# Patient Record
Sex: Female | Born: 1990 | Race: White | Hispanic: No | Marital: Single | Smoking: Former smoker
Health system: Southern US, Community
[De-identification: ages and names within clinical notes are randomized; demographics above are authoritative.]

## PROBLEM LIST (undated history)

## (undated) ENCOUNTER — Inpatient Hospital Stay (HOSPITAL_COMMUNITY): Payer: Self-pay

## (undated) DIAGNOSIS — G8929 Other chronic pain: Secondary | ICD-10-CM

## (undated) DIAGNOSIS — R51 Headache: Secondary | ICD-10-CM

## (undated) DIAGNOSIS — J302 Other seasonal allergic rhinitis: Secondary | ICD-10-CM

## (undated) DIAGNOSIS — S069X9A Unspecified intracranial injury with loss of consciousness of unspecified duration, initial encounter: Secondary | ICD-10-CM

## (undated) DIAGNOSIS — R519 Headache, unspecified: Secondary | ICD-10-CM

## (undated) HISTORY — PX: WISDOM TOOTH EXTRACTION: SHX21

---
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
## 2012-04-26 ENCOUNTER — Other Ambulatory Visit: Payer: Self-pay | Admitting: Nurse Practitioner

## 2012-04-26 ENCOUNTER — Other Ambulatory Visit (HOSPITAL_COMMUNITY)
Admission: RE | Admit: 2012-04-26 | Discharge: 2012-04-26 | Disposition: A | Discharge status: Home / Self Care | Payer: BC Managed Care – HMO | Source: Ambulatory Visit | Attending: Obstetrics and Gynecology | Admitting: Obstetrics and Gynecology

## 2012-04-26 DIAGNOSIS — Z113 Encounter for screening for infections with a predominantly sexual mode of transmission: Secondary | ICD-10-CM | POA: Insufficient documentation

## 2012-11-10 ENCOUNTER — Ambulatory Visit: Payer: Self-pay | Admitting: Physician Assistant

## 2012-11-10 VITALS — BP 112/78 | HR 76 | Temp 98.1°F | Resp 16 | Ht 67.5 in | Wt 151.0 lb

## 2012-11-10 DIAGNOSIS — H6990 Unspecified Eustachian tube disorder, unspecified ear: Secondary | ICD-10-CM

## 2012-11-10 DIAGNOSIS — H6980 Other specified disorders of Eustachian tube, unspecified ear: Secondary | ICD-10-CM

## 2012-11-10 DIAGNOSIS — H6993 Unspecified Eustachian tube disorder, bilateral: Secondary | ICD-10-CM

## 2012-11-10 DIAGNOSIS — H6690 Otitis media, unspecified, unspecified ear: Secondary | ICD-10-CM

## 2012-11-10 DIAGNOSIS — H6692 Otitis media, unspecified, left ear: Secondary | ICD-10-CM

## 2012-11-10 DIAGNOSIS — H6983 Other specified disorders of Eustachian tube, bilateral: Secondary | ICD-10-CM

## 2012-11-10 MED ORDER — AMOXICILLIN 875 MG PO TABS: 875.0000 mg | ORAL_TABLET | Freq: Two times a day (BID) | ORAL | Status: DC

## 2012-11-10 MED ORDER — IPRATROPIUM BROMIDE 0.03 % NA SOLN: 2.0000 | Freq: Two times a day (BID) | NASAL | Status: DC

## 2012-11-10 NOTE — Progress Notes (Signed)
  Subjective:    Patient ID: Cassidy Thompson, female    DOB: [birth year], 22 y.o.   MRN: [id number]  Otalgia  Associated symptoms include coughing and rhinorrhea. Pertinent negatives include no abdominal pain, ear discharge, headaches or vomiting.   22 year old female presents with 6 day history of left ear pain, pressure, and decreased hearing.  Admits both ears feel full but left is worse than the right.  Also complains of slight nasal congestion, rhinorrhea, and dry cough.  Does have hx of allergic rhinitis treated with Zyrtec and Singulair in the past but is not currently taking them.  Denies fever, chills, nausea, vomiting, headache, tinnitus, SOB, nausea, vomiting, or dizziness.  Had strep 2 weeks ago that was treated with PCN.  Admits to hx of ear infections, usually once per year.   Patient is otherwise healthy with no other concerns today.   Works at a daycare.      Review of Systems  Constitutional: Negative for fever and chills.  HENT: Positive for ear pain, congestion, rhinorrhea and postnasal drip. Negative for ear discharge.   Respiratory: Positive for cough. Negative for shortness of breath.   Gastrointestinal: Negative for nausea, vomiting and abdominal pain.  Neurological: Negative for dizziness and headaches.       Objective:   Physical Exam  Constitutional: She is oriented to person, place, and time. She appears well-developed and well-nourished.  HENT:  Head: Normocephalic and atraumatic.  Right Ear: Hearing, external ear and ear canal normal. No drainage. Tympanic membrane is not perforated. A middle ear effusion is present.  Left Ear: Hearing, external ear and ear canal normal. No drainage. Tympanic membrane is erythematous. Tympanic membrane is not perforated. A middle ear effusion is present.  Mouth/Throat: Uvula is midline, oropharynx is clear and moist and mucous membranes are normal.  Eyes: Conjunctivae are normal.  Neck: Normal range of motion. Neck supple.   Cardiovascular: Normal rate, regular rhythm and normal heart sounds.   Lymphadenopathy:    She has no cervical adenopathy.  Neurological: She is alert and oriented to person, place, and time.  Psychiatric: She has a normal mood and affect. Her behavior is normal. Judgment and thought content normal.          Assessment & Plan:  ETD (eustachian tube dysfunction), bilateral - Plan: ipratropium (ATROVENT) 0.03 % nasal spray  Otitis media, left - Plan: amoxicillin (AMOXIL) 875 MG tablet  ETD with early otitis media.  Start Atrovent NS twice daily and amoxicillin 875 mg bid x 10 days Recommend Zyrtec daily to help with seasonal allergies.  Will hold on Singulair at this time due to allergies fairly well controlled. Ok to rx if requested.

## 2013-01-19 ENCOUNTER — Inpatient Hospital Stay (HOSPITAL_COMMUNITY): Payer: Medicaid Other

## 2013-01-19 ENCOUNTER — Encounter (HOSPITAL_COMMUNITY): Payer: Self-pay

## 2013-01-19 ENCOUNTER — Inpatient Hospital Stay (HOSPITAL_COMMUNITY)
Admission: AD | Admit: 2013-01-19 | Discharge: 2013-01-19 | Disposition: A | Discharge status: Home / Self Care | Payer: Medicaid Other | Source: Ambulatory Visit | Attending: Obstetrics & Gynecology | Admitting: Obstetrics & Gynecology

## 2013-01-19 DIAGNOSIS — R109 Unspecified abdominal pain: Secondary | ICD-10-CM | POA: Insufficient documentation

## 2013-01-19 DIAGNOSIS — O021 Missed abortion: Secondary | ICD-10-CM | POA: Insufficient documentation

## 2013-01-19 DIAGNOSIS — O039 Complete or unspecified spontaneous abortion without complication: Secondary | ICD-10-CM

## 2013-01-19 DIAGNOSIS — O3680X1 Pregnancy with inconclusive fetal viability, fetus 1: Secondary | ICD-10-CM

## 2013-01-19 DIAGNOSIS — O200 Threatened abortion: Secondary | ICD-10-CM

## 2013-01-19 HISTORY — DX: Unspecified intracranial injury with loss of consciousness of unspecified duration, initial encounter: S06.9X9A

## 2013-01-19 LAB — URINE MICROSCOPIC-ADD ON

## 2013-01-19 LAB — CBC
HCT: 36.4 % (ref 36.0–46.0)
Hemoglobin: 12.8 g/dL (ref 12.0–15.0)
MCH: 30.8 pg (ref 26.0–34.0)
MCHC: 35.2 g/dL (ref 30.0–36.0)
MCV: 87.5 fL (ref 78.0–100.0)
Platelets: 234 10*3/uL (ref 150–400)
RBC: 4.16 MIL/uL (ref 3.87–5.11)
RDW: 12.4 % (ref 11.5–15.5)
WBC: 9.2 K/uL (ref 4.0–10.5)

## 2013-01-19 LAB — URINALYSIS, ROUTINE W REFLEX MICROSCOPIC
Bilirubin Urine: NEGATIVE
Glucose, UA: NEGATIVE mg/dL
Ketones, ur: NEGATIVE mg/dL
Nitrite: NEGATIVE
Protein, ur: NEGATIVE mg/dL
Specific Gravity, Urine: 1.015 (ref 1.005–1.030)
Urobilinogen, UA: 0.2 mg/dL (ref 0.0–1.0)
pH: 7 (ref 5.0–8.0)

## 2013-01-19 LAB — WET PREP, GENITAL
Clue Cells Wet Prep HPF POC: NONE SEEN
Trich, Wet Prep: NONE SEEN
Yeast Wet Prep HPF POC: NONE SEEN

## 2013-01-19 LAB — ABO/RH: ABO/RH(D): A POS

## 2013-01-19 LAB — HCG, QUANTITATIVE, PREGNANCY: hCG, Beta Chain, Quant, S: 4870 m[IU]/mL — ABNORMAL HIGH (ref <5)

## 2013-01-19 MED ORDER — IBUPROFEN 800 MG PO TABS: 800.0000 mg | ORAL_TABLET | Freq: Three times a day (TID) | ORAL | Status: DC

## 2013-01-19 MED ORDER — OXYCODONE-ACETAMINOPHEN 5-325 MG PO TABS: 2.0000 | ORAL_TABLET | ORAL | Status: DC | PRN

## 2013-01-19 MED ORDER — PROMETHAZINE HCL 25 MG PO TABS: 25.0000 mg | ORAL_TABLET | Freq: Four times a day (QID) | ORAL | Status: DC | PRN

## 2013-01-19 NOTE — MAU Note (Signed)
Patient states she has had a positive pregnancy test at [facility]. States she has been having mild cramping for about 2 weeks but getting worse. Started spotting yesterday, moe today.

## 2013-01-19 NOTE — Discharge Instructions (Signed)
Miscarriage A miscarriage is the sudden loss of an unborn baby (fetus) before the 20th week of pregnancy. Most miscarriages happen in the first 3 months of pregnancy. Sometimes, it happens before a woman even knows she is pregnant. A miscarriage is also called a "spontaneous miscarriage" or "early pregnancy loss." Having a miscarriage can be an emotional experience. Talk with your caregiver about any questions you may have about miscarrying, the grieving process, and your future pregnancy plans. CAUSES   Problems with the fetal chromosomes that make it impossible for the baby to develop normally. Problems with the baby's genes or chromosomes are most often the result of errors that occur, by chance, as the embryo divides and grows. The problems are not inherited from the parents.  Infection of the cervix or uterus.   Hormone problems.   Problems with the cervix, such as having an incompetent cervix. This is when the tissue in the cervix is not strong enough to hold the pregnancy.   Problems with the uterus, such as an abnormally shaped uterus, uterine fibroids, or congenital abnormalities.   Certain medical conditions.   Smoking, drinking alcohol, or taking illegal drugs.   Trauma.  Often, the cause of a miscarriage is unknown.  SYMPTOMS   Vaginal bleeding or spotting, with or without cramps or pain.  Pain or cramping in the abdomen or lower back.  Passing fluid, tissue, or blood clots from the vagina. DIAGNOSIS  Your caregiver will perform a physical exam. You may also have an ultrasound to confirm the miscarriage. Blood or urine tests may also be ordered. TREATMENT   Sometimes, treatment is not necessary if you naturally pass all the fetal tissue that was in the uterus. If some of the fetus or placenta remains in the body (incomplete miscarriage), tissue left behind may become infected and must be removed. Usually, a dilation and curettage (D and C) procedure is performed.  During a D and C procedure, the cervix is widened (dilated) and any remaining fetal or placental tissue is gently removed from the uterus.  Antibiotic medicines are prescribed if there is an infection. Other medicines may be given to reduce the size of the uterus (contract) if there is a lot of bleeding.  If you have Rh negative blood and your baby was Rh positive, you will need a Rh immunoglobulin shot. This shot will protect any future baby from having Rh blood problems in future pregnancies. HOME CARE INSTRUCTIONS   Your caregiver may order bed rest or may allow you to continue light activity. Resume activity as directed by your caregiver.  Have someone help with home and family responsibilities during this time.   Keep track of the number of sanitary pads you use each day and how soaked (saturated) they are. Write down this information.   Do not use tampons. Do not douche or have sexual intercourse until approved by your caregiver.   Only take over-the-counter or prescription medicines for pain or discomfort as directed by your caregiver.   Do not take aspirin. Aspirin can cause bleeding.   Keep all follow-up appointments with your caregiver.   If you or your partner have problems with grieving, talk to your caregiver or seek counseling to help cope with the pregnancy loss. Allow enough time to grieve before trying to get pregnant again.  SEEK IMMEDIATE MEDICAL CARE IF:   You have severe cramps or pain in your back or abdomen.  You have a fever.  You pass large blood clots (walnut-sized   or larger) ortissue from your vagina. Save any tissue for your caregiver to inspect.   Your bleeding increases.   You have a thick, bad-smelling vaginal discharge.  You become lightheaded, weak, or you faint.   You have chills.  MAKE SURE YOU:  Understand these instructions.  Will watch your condition.  Will get help right away if you are not doing well or get  worse. Document Released: 07/20/2000 Document Revised: 05/21/2012 Document Reviewed: 03/15/2011 [employer] Patient Information 2014 [employer].  

## 2013-01-19 NOTE — MAU Note (Signed)
22 yo, G1P0 at [redacted]w[redacted]d, presents to MAU with c/o VB since 1600 yesterday; reports intermittent cramping. Denies taking anything for pain at this time.

## 2013-01-19 NOTE — MAU Provider Note (Signed)
History     CSN: [id number]  Arrival date and time: 01/19/13 1523   First Provider Initiated Contact with Patient 01/19/13 1608      Chief Complaint  Patient presents with  . Vaginal Bleeding  . Abdominal Pain   HPI Ms. Cassidy Thompson is a 22 y.o. G1P0 at 109w1d who presents to MAU today with complaint of vaginal bleeding. The patient states that she notices pink spotting with wiping only. She is also having associate diffuse mild cramping abdominal pain that comes and goes. She endorses fatigue and nausea without vomiting, diarrhea or constipation. She denies dizziness or weakness, fever, vaginal discharge, UTI symptoms or fever. Last intercourse was ~ 5 days ago.   OB History   Grav Para Term Preterm Abortions TAB SAB Ect Mult Living   1               Past Medical History  Diagnosis Date  . Allergy   . Closed TBI (traumatic brain injury)     Past Surgical History  Procedure Laterality Date  . Brain surgery      injury in 2011  . Wisdom tooth extraction      Family History  Problem Relation Age of Onset  . Diabetes Maternal Grandmother     History  Substance Use Topics  . Smoking status: Former Games developer  . Smokeless tobacco: Not on file  . Alcohol Use: No    Allergies: No Known Allergies  Prescriptions prior to admission  Medication Sig Dispense Refill  . Prenatal Vit-Fe Fumarate-FA (PRENATAL MULTIVITAMIN) TABS tablet Take 1 tablet by mouth daily at 12 noon.        Review of Systems  Constitutional: Positive for malaise/fatigue. Negative for fever.  Gastrointestinal: Positive for nausea and abdominal pain. Negative for vomiting, diarrhea and constipation.  Genitourinary: Negative for dysuria, urgency and frequency.       + vaginal bleeding Neg - vaginal discharge  Neurological: Negative for dizziness, loss of consciousness and weakness.   Physical Exam   Blood pressure 119/67, pulse 104, temperature 98.5 F (36.9 C), temperature source Oral, resp.  rate 18, height 5\' 7"  (1.702 m), weight 149 lb 9.6 oz (67.858 kg), last menstrual period 11/23/2012, SpO2 100.00%.  Physical Exam  Constitutional: She is oriented to person, place, and time. She appears well-developed and well-nourished. No distress.  HENT:  Head: Normocephalic and atraumatic.  Cardiovascular: Normal rate, regular rhythm and normal heart sounds.   Respiratory: Effort normal and breath sounds normal. No respiratory distress.  GI: Soft. Bowel sounds are normal. She exhibits no distension and no mass. There is no tenderness. There is no rebound and no guarding.  Genitourinary: Uterus is enlarged (appropriate for GA). Uterus is not tender. Cervix exhibits no motion tenderness, no discharge and no friability. Right adnexum displays no mass and no tenderness. Left adnexum displays no mass and no tenderness. There is bleeding (scant light pink blood noted) around the vagina. Vaginal discharge (scant blood tinged mucus discharge noted) found.  Neurological: She is alert and oriented to person, place, and time.  Skin: Skin is warm and dry. No erythema.  Psychiatric: She has a normal mood and affect.  Cervix: closed, long, thick  Results for orders placed during the hospital encounter of 01/19/13 (from the past 24 hour(s))  URINALYSIS, ROUTINE W REFLEX MICROSCOPIC     Status: Abnormal   Collection Time    01/19/13  3:45 PM      Result Value Range   Color,  Urine YELLOW  YELLOW   APPearance CLEAR  CLEAR   Specific Gravity, Urine 1.015  1.005 - 1.030   pH 7.0  5.0 - 8.0   Glucose, UA NEGATIVE  NEGATIVE mg/dL   Hgb urine dipstick LARGE (*) NEGATIVE   Bilirubin Urine NEGATIVE  NEGATIVE   Ketones, ur NEGATIVE  NEGATIVE mg/dL   Protein, ur NEGATIVE  NEGATIVE mg/dL   Urobilinogen, UA 0.2  0.0 - 1.0 mg/dL   Nitrite NEGATIVE  NEGATIVE   Leukocytes, UA TRACE (*) NEGATIVE  URINE MICROSCOPIC-ADD ON     Status: Abnormal   Collection Time    01/19/13  3:45 PM      Result Value Range    Squamous Epithelial / LPF RARE  RARE   WBC, UA 3-6  <3 WBC/hpf   RBC / HPF 0-2  <3 RBC/hpf   Bacteria, UA FEW (*) RARE  WET PREP, GENITAL     Status: Abnormal   Collection Time    01/19/13  4:20 PM      Result Value Range   Yeast Wet Prep HPF POC NONE SEEN  NONE SEEN   Trich, Wet Prep NONE SEEN  NONE SEEN   Clue Cells Wet Prep HPF POC NONE SEEN  NONE SEEN   WBC, Wet Prep HPF POC MODERATE (*) NONE SEEN  HCG, QUANTITATIVE, PREGNANCY     Status: Abnormal   Collection Time    01/19/13  4:35 PM      Result Value Range   hCG, Beta Chain, Quant, S 4870 (*) <5 mIU/mL  CBC     Status: None   Collection Time    01/19/13  4:35 PM      Result Value Range   WBC 9.2  4.0 - 10.5 K/uL   RBC 4.16  3.87 - 5.11 MIL/uL   Hemoglobin 12.8  12.0 - 15.0 g/dL   HCT 16.1  09.6 - 04.5 %   MCV 87.5  78.0 - 100.0 fL   MCH 30.8  26.0 - 34.0 pg   MCHC 35.2  30.0 - 36.0 g/dL   RDW 40.9  81.1 - 91.4 %   Platelets 234  150 - 400 K/uL  ABO/RH     Status: None   Collection Time    01/19/13  4:35 PM      Result Value Range   ABO/RH(D) A POS     US Ob Transvaginal  01/19/2013   CLINICAL DATA:  Vaginal bleeding since 1600 hr 01/18/2013. Intermittent cramping.  EXAM: OBSTETRIC <14 WK [place] AND TRANSVAGINAL OB US  TECHNIQUE: Both transabdominal and transvaginal ultrasound examinations were performed for complete evaluation of the gestation as well as the maternal uterus, adnexal regions, and pelvic cul-de-sac. Transvaginal technique was performed to assess early pregnancy.  COMPARISON:  None.  FINDINGS: Intrauterine gestational sac: Visualized, and normal in shape. Single.  Yolk sac:  Present  Embryo:  Present  Cardiac Activity: Absent  Heart Rate:  0 bpm  CRL:   9  mm   7 w 0 d                  [place] EDC: 09/07/2013  Maternal uterus/adnexae: Small subchorionic hemorrhage. Normal right ovary. Left ovary corpus luteum cyst. No free fluid.  IMPRESSION: Findings meet definitive criteria for failed pregnancy. Crown-rump length  of greater than or equal to 7 mm with no cardiac activity. This follows SRU consensus guidelines: Diagnostic Criteria for Nonviable Pregnancy Early in the First Trimester. N Engl  J Med [phone].   Electronically Signed   By: Davonna Belling M.D.   On: 01/19/2013 17:31     MAU Course  Procedures None  MDM Discussed patient with Dr. Su Hilt. Rule out ectopic pregnancy with quant hCG, CBC and [place] today UA, Wet prep, GC/Chlamydia, CBC, ABO/Rh, quant hCG and [place] today Discussed [place] and lab results with Dr. Su Hilt. Discuss management options with patient Patient counseled on expectant management vs Cytotec vs D&C. Patient would prefer expectant management at this time.  Bleeding precautions discussed Assessment and Plan  A: Missed AB  P: Discharge home Rx for Percocet, Ibuprofen and Phenergan given/sent to patient's pharmacy Bleeding precautions discussed Patient advised to keep scheduled appointment for follow-up on Tuesday Patient may return to MAU as needed or if her condition were to change or worsen   Freddi Starr, PA-C  01/19/2013, 5:58 PM

## 2013-01-20 LAB — URINE CULTURE: Colony Count: 4000

## 2013-01-21 LAB — GC/CHLAMYDIA PROBE AMP
CT Probe RNA: NEGATIVE
GC Probe RNA: NEGATIVE

## 2013-06-27 ENCOUNTER — Other Ambulatory Visit: Payer: Self-pay | Admitting: Nurse Practitioner

## 2013-06-27 ENCOUNTER — Other Ambulatory Visit (HOSPITAL_COMMUNITY)
Admission: RE | Admit: 2013-06-27 | Discharge: 2013-06-27 | Disposition: A | Discharge status: Home / Self Care | Payer: Medicaid Other | Source: Ambulatory Visit | Attending: Nurse Practitioner | Admitting: Nurse Practitioner

## 2013-06-27 DIAGNOSIS — Z113 Encounter for screening for infections with a predominantly sexual mode of transmission: Secondary | ICD-10-CM | POA: Insufficient documentation

## 2013-06-27 DIAGNOSIS — Z01419 Encounter for gynecological examination (general) (routine) without abnormal findings: Secondary | ICD-10-CM | POA: Insufficient documentation

## 2013-11-24 ENCOUNTER — Encounter (HOSPITAL_COMMUNITY): Payer: Self-pay | Admitting: *Deleted

## 2013-12-09 ENCOUNTER — Encounter (HOSPITAL_COMMUNITY): Payer: Self-pay | Admitting: *Deleted

## 2014-07-29 ENCOUNTER — Other Ambulatory Visit: Payer: Self-pay | Admitting: Nurse Practitioner

## 2014-07-29 ENCOUNTER — Other Ambulatory Visit (HOSPITAL_COMMUNITY)
Admission: RE | Admit: 2014-07-29 | Discharge: 2014-07-29 | Disposition: A | Discharge status: Home / Self Care | Payer: BLUE CROSS/BLUE SHIELD | Source: Ambulatory Visit | Attending: Nurse Practitioner | Admitting: Nurse Practitioner

## 2014-07-29 DIAGNOSIS — Z113 Encounter for screening for infections with a predominantly sexual mode of transmission: Secondary | ICD-10-CM | POA: Diagnosis present

## 2014-07-29 DIAGNOSIS — Z01419 Encounter for gynecological examination (general) (routine) without abnormal findings: Secondary | ICD-10-CM | POA: Diagnosis present

## 2014-08-05 LAB — CYTOLOGY - PAP

## 2015-07-09 DIAGNOSIS — J309 Allergic rhinitis, unspecified: Secondary | ICD-10-CM | POA: Diagnosis not present

## 2015-07-09 DIAGNOSIS — F0781 Postconcussional syndrome: Secondary | ICD-10-CM | POA: Diagnosis not present

## 2015-07-09 DIAGNOSIS — Z0000 Encounter for general adult medical examination without abnormal findings: Secondary | ICD-10-CM | POA: Diagnosis not present

## 2015-08-06 ENCOUNTER — Other Ambulatory Visit (HOSPITAL_COMMUNITY)
Admission: RE | Admit: 2015-08-06 | Discharge: 2015-08-06 | Disposition: A | Discharge status: Home / Self Care | Payer: 59 | Source: Ambulatory Visit | Attending: Nurse Practitioner | Admitting: Nurse Practitioner

## 2015-08-06 ENCOUNTER — Other Ambulatory Visit: Payer: Self-pay | Admitting: Nurse Practitioner

## 2015-08-06 DIAGNOSIS — N760 Acute vaginitis: Secondary | ICD-10-CM | POA: Insufficient documentation

## 2015-08-06 DIAGNOSIS — Z113 Encounter for screening for infections with a predominantly sexual mode of transmission: Secondary | ICD-10-CM | POA: Diagnosis present

## 2015-08-06 DIAGNOSIS — Z01419 Encounter for gynecological examination (general) (routine) without abnormal findings: Secondary | ICD-10-CM | POA: Diagnosis present

## 2015-08-06 DIAGNOSIS — Z309 Encounter for contraceptive management, unspecified: Secondary | ICD-10-CM | POA: Diagnosis not present

## 2015-08-06 DIAGNOSIS — N72 Inflammatory disease of cervix uteri: Secondary | ICD-10-CM | POA: Diagnosis not present

## 2015-08-07 LAB — CYTOLOGY - PAP

## 2015-11-08 ENCOUNTER — Ambulatory Visit (HOSPITAL_COMMUNITY)
Admission: EM | Admit: 2015-11-08 | Discharge: 2015-11-08 | Disposition: A | Discharge status: Home / Self Care | Payer: 59 | Attending: Family Medicine | Admitting: Family Medicine

## 2015-11-08 ENCOUNTER — Ambulatory Visit (INDEPENDENT_AMBULATORY_CARE_PROVIDER_SITE_OTHER): Payer: 59

## 2015-11-08 ENCOUNTER — Encounter (HOSPITAL_COMMUNITY): Payer: Self-pay | Admitting: *Deleted

## 2015-11-08 DIAGNOSIS — M79604 Pain in right leg: Secondary | ICD-10-CM

## 2015-11-08 HISTORY — DX: Headache, unspecified: R51.9

## 2015-11-08 HISTORY — DX: Other chronic pain: G89.29

## 2015-11-08 HISTORY — DX: Headache: R51

## 2015-11-08 HISTORY — DX: Other seasonal allergic rhinitis: J30.2

## 2015-11-08 MED ORDER — NAPROXEN 500 MG PO TABS: 500.0000 mg | ORAL_TABLET | Freq: Two times a day (BID) | ORAL | 0 refills | Status: DC

## 2015-11-08 NOTE — ED Triage Notes (Signed)
Reports jumping off the back of golf cart last night; landed onto both feet, but had instant pain to right lateral knee extending into proximal lower leg.  C/O painful ambulation and rotation of leg.  Able to bear weight.

## 2015-11-08 NOTE — Discharge Instructions (Signed)
Follow up with orthopedics if pain is not better in 1 week.

## 2015-11-08 NOTE — ED Provider Notes (Signed)
CSN: [id number]     Arrival date & time 11/08/15  1648 History   None    Chief Complaint  Patient presents with  . Leg Injury   (Consider location/radiation/quality/duration/timing/severity/associated sxs/prior Treatment) 25 y.o. female presents with pain to lateral aspect of her right tibia fib X 1 day. Patient states that she was standing on the back of a golf cart when it when over a bump and she fell off. Patient states that she landed on her feet and denies any additional injures. Condition is acute in nature. Condition is made better by nothing. Condition is made worse by nothing. Patient denies any treatment prior to there arrival at this facility. Patient is able to bear weight on the affected extremity but states that the pain radiates from proximal end of the tib fib down to the distal end. Extremity is warm, cap refill is < 2, no decrease in sensation       Past Medical History:  Diagnosis Date  . Chronic headaches   . Closed TBI (traumatic brain injury) ([place])   . Seasonal allergies    Past Surgical History:  Procedure Laterality Date  . BRAIN SURGERY     injury in 2011  . WISDOM TOOTH EXTRACTION     Family History  Problem Relation Age of Onset  . Diabetes Maternal Grandmother    Social History  Substance Use Topics  . Smoking status: Former Research scientist (life sciences)  . Smokeless tobacco: Never Used  . Alcohol use Yes     Comment: occasional   OB History    Gravida Para Term Preterm AB Living   1             SAB TAB Ectopic Multiple Live Births                 Review of Systems  Constitutional: Negative.   Musculoskeletal:       Pain to lateral aspect of right lower leg. Radiates down leg    Allergies  Review of patient's allergies indicates no known allergies.  Home Medications   Prior to Admission medications   Medication Sig Start Date End Date Taking? Authorizing Provider  Cetirizine HCl (ZYRTEC PO) Take by mouth.   Yes Historical Provider, MD  GABAPENTIN PO  Take by mouth.   Yes Historical Provider, MD  Montelukast Sodium (SINGULAIR PO) Take by mouth.   Yes Historical Provider, MD  UNKNOWN TO PATIENT BCPs   Yes Historical Provider, MD  ibuprofen (ADVIL,MOTRIN) 800 MG tablet Take 1 tablet (800 mg total) by mouth 3 (three) times daily. 01/19/13   Luvenia Redden, PA-C  naproxen (NAPROSYN) 500 MG tablet Take 1 tablet (500 mg total) by mouth 2 (two) times daily. 11/08/15   Jacqualine Mau, NP  oxyCODONE-acetaminophen (PERCOCET/ROXICET) 5-325 MG per tablet Take 2 tablets by mouth every 4 (four) hours as needed for severe pain. 01/19/13   Luvenia Redden, PA-C  Prenatal Vit-Fe Fumarate-FA (PRENATAL MULTIVITAMIN) TABS tablet Take 1 tablet by mouth daily at 12 noon.    Historical Provider, MD  promethazine (PHENERGAN) 25 MG tablet Take 1 tablet (25 mg total) by mouth every 6 (six) hours as needed for nausea or vomiting. 01/19/13   Luvenia Redden, PA-C   Meds Ordered and Administered this Visit  Medications - No data to display  LMP 09/15/2015 (Exact Date) Comment: Takes consecutive BCPs No data found.   Physical Exam  Constitutional: She is oriented to person, place, and time. She appears well-developed and  well-nourished.  HENT:  Head: Normocephalic and atraumatic.  Eyes: Conjunctivae are normal.  Neck: Normal range of motion.  Pulmonary/Chest: Effort normal.  Musculoskeletal: Normal range of motion. She exhibits tenderness (to lateral aspect of right lower leg). She exhibits no edema or deformity.  Neurological: She is alert and oriented to person, place, and time.  Skin: Skin is warm and dry. Capillary refill takes less than 2 seconds.  Psychiatric: She has a normal mood and affect.  Nursing note and vitals reviewed.   Urgent Care Course   Clinical Course    Procedures (including critical care time)  Labs Review Labs Reviewed - No data to display  Imaging Review Dg Tibia/fibula Right  Result Date: 11/08/2015 CLINICAL DATA:  Fall  from golf cart yesterday with persistent right leg pain, initial encounter EXAM: RIGHT TIBIA AND FIBULA - 2 VIEW COMPARISON:  None. FINDINGS: There is no evidence of fracture or other focal bone lesions. Soft tissues are unremarkable. IMPRESSION: No acute abnormality noted. Electronically Signed   By: Inez Catalina M.D.   On: 11/08/2015 18:20     Visual Acuity Review  Right Eye Distance:   Left Eye Distance:   Bilateral Distance:    Right Eye Near:   Left Eye Near:    Bilateral Near:         MDM   1. Leg pain, lateral, right       Jacqualine Mau, NP 11/08/15 1840

## 2017-07-16 IMAGING — DX DG TIBIA/FIBULA 2V*R*
3 series · 3 of 3 positions shown · non-contrast
Comparison: None.

CLINICAL DATA: Fall from golf cart yesterday with persistent right
leg pain, initial encounter

EXAM:
RIGHT TIBIA AND FIBULA - 2 VIEW

[tibia ap (1 of 2)]
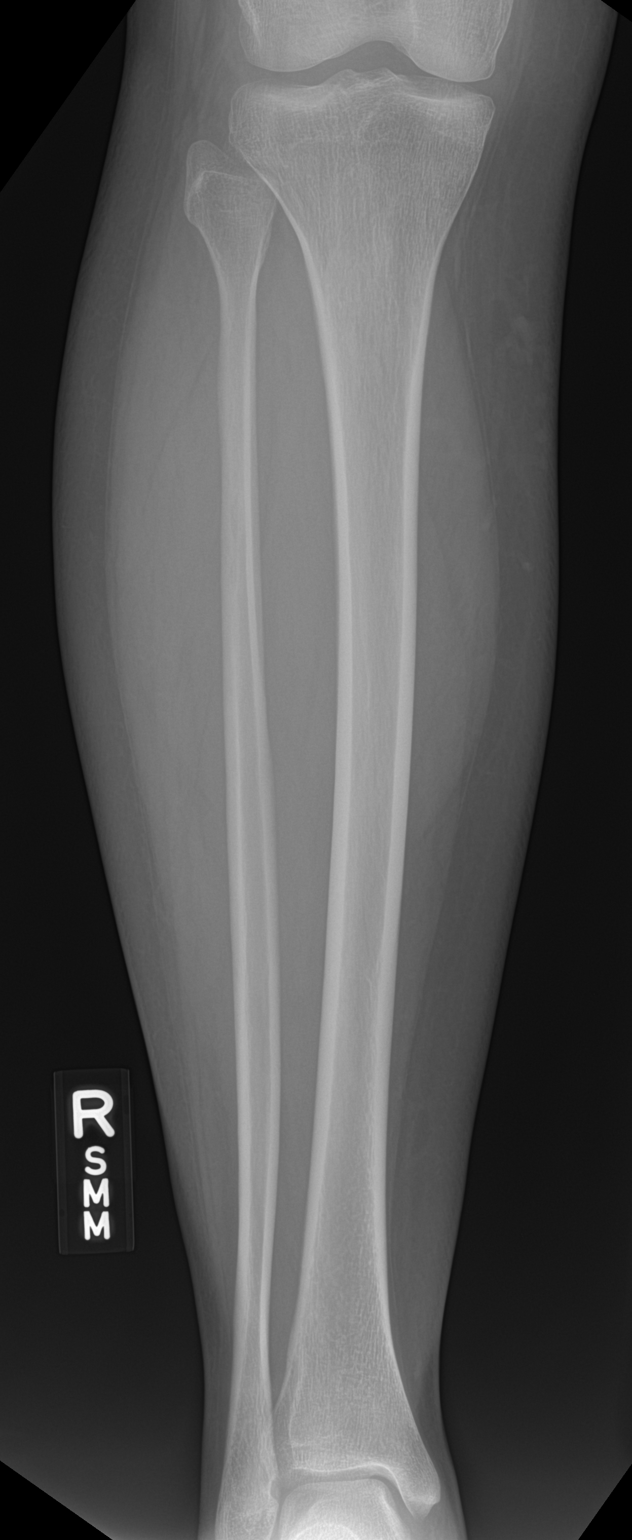

[tibia ap (2 of 2)]
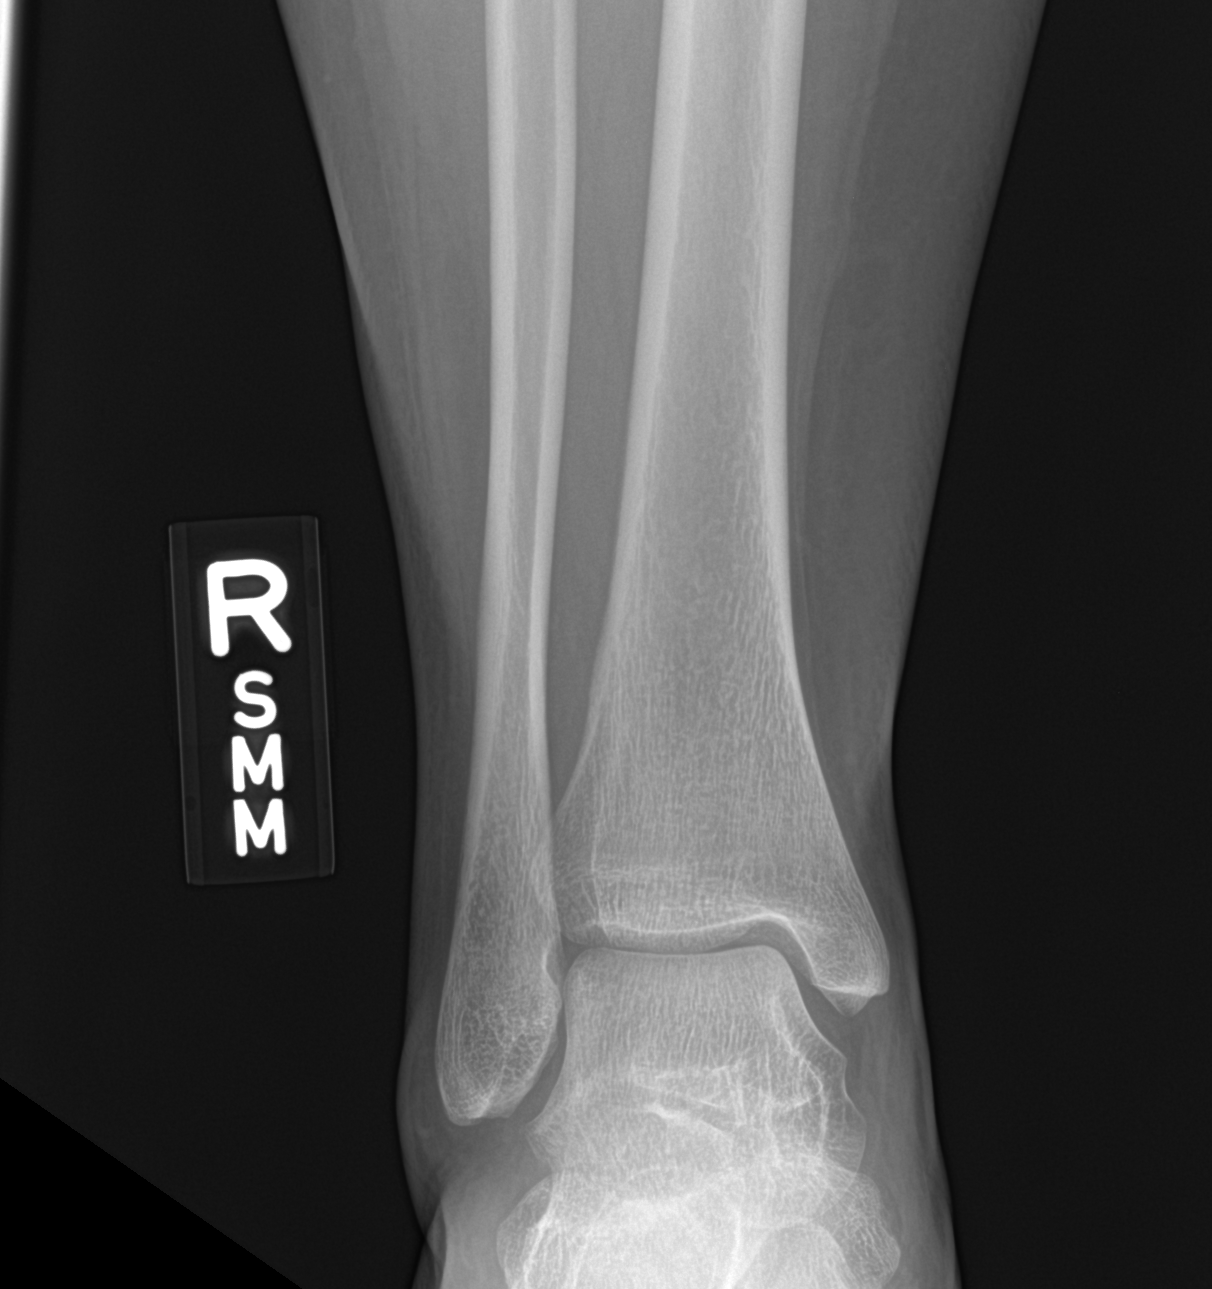

[tibia lat]
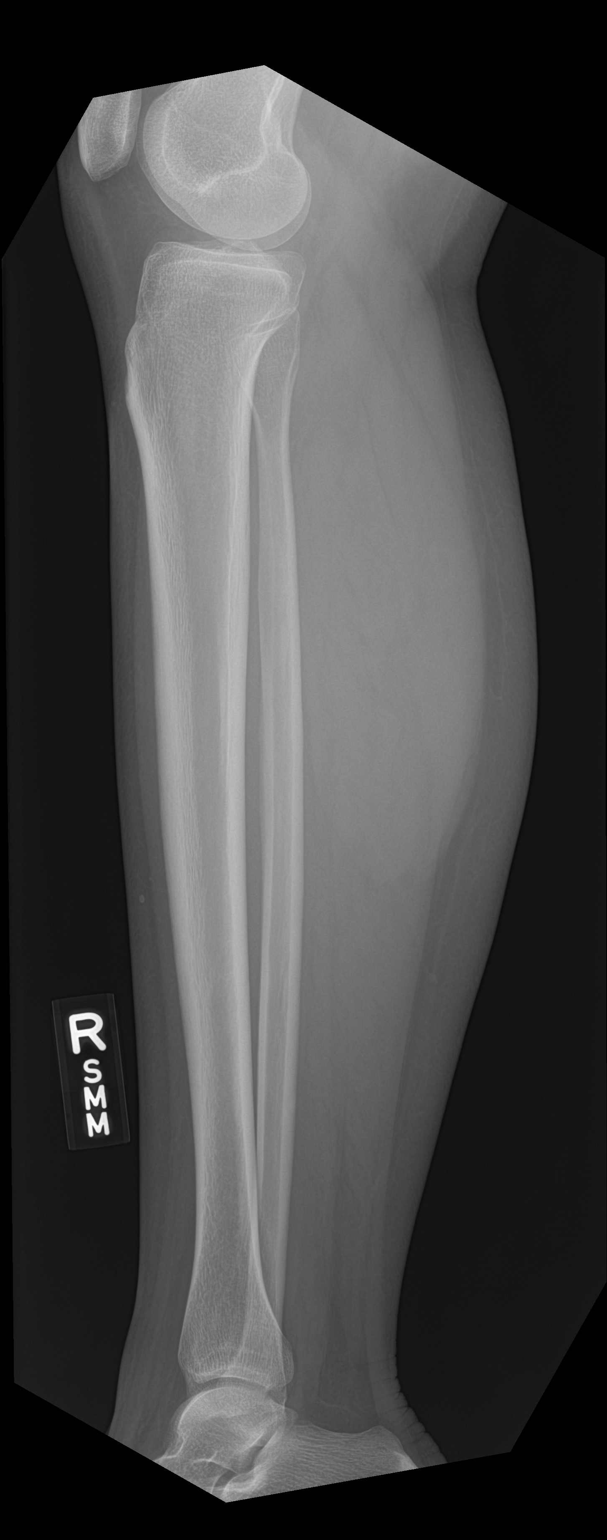

[3 of 3 positions shown; findings below may reference images not displayed]

FINDINGS: There is no evidence of fracture or other focal bone lesions. Soft
tissues are unremarkable.
IMPRESSION: No acute abnormality noted.

## 2017-12-25 ENCOUNTER — Encounter (HOSPITAL_COMMUNITY): Payer: Self-pay | Admitting: Emergency Medicine

## 2017-12-25 ENCOUNTER — Ambulatory Visit (HOSPITAL_COMMUNITY)
Admission: EM | Admit: 2017-12-25 | Discharge: 2017-12-25 | Disposition: A | Discharge status: Home / Self Care | Payer: BC Managed Care – PPO | Attending: Family Medicine | Admitting: Family Medicine

## 2017-12-25 DIAGNOSIS — B349 Viral infection, unspecified: Secondary | ICD-10-CM | POA: Diagnosis not present

## 2017-12-25 MED ORDER — ONDANSETRON 4 MG PO TBDP
4.0000 mg | ORAL_TABLET | Freq: Once | ORAL | Status: AC
  Administered 2017-12-25: 4 mg via ORAL

## 2017-12-25 MED ORDER — IPRATROPIUM BROMIDE 0.06 % NA SOLN: 2.0000 | Freq: Four times a day (QID) | NASAL | 0 refills | Status: DC

## 2017-12-25 MED ORDER — BENZONATATE 100 MG PO CAPS: 100.0000 mg | ORAL_CAPSULE | Freq: Three times a day (TID) | ORAL | 0 refills | Status: DC

## 2017-12-25 MED ORDER — ONDANSETRON 4 MG PO TBDP: 4.0000 mg | ORAL_TABLET | Freq: Three times a day (TID) | ORAL | 0 refills | Status: DC | PRN

## 2017-12-25 MED ORDER — ONDANSETRON 4 MG PO TBDP
ORAL_TABLET | ORAL | Status: AC
  Filled 2017-12-25: qty 1

## 2017-12-25 NOTE — ED Provider Notes (Signed)
[place]    CSN: [id number] Arrival date & time: 12/25/17  1425     History   Chief Complaint Chief Complaint  Patient presents with  . Fever    HPI Cassidy Thompson is a 27 y.o. female.   27 year old female comes in for 3 day history of URI symptoms. States first started with cough, rhinorrhea, nasal congestion. States yesterday, started with body aches and vomiting. Had one episode of nonbilious nonbloody vomiting, states continued nausea. States has decreased appetite, so has not tried to eat again, but has been able to tolerate fluids. Fever, Tmax 101.5, denies abdominal pain, diarrhea. Former smoker. No obvious sick contact.      Past Medical History:  Diagnosis Date  . Chronic headaches   . Closed TBI (traumatic brain injury) ([place])   . Seasonal allergies     There are no active problems to display for this patient.   Past Surgical History:  Procedure Laterality Date  . BRAIN SURGERY     injury in 2011  . WISDOM TOOTH EXTRACTION      OB History    Gravida  1   Para      Term      Preterm      AB      Living        SAB      TAB      Ectopic      Multiple      Live Births               Home Medications    Prior to Admission medications   Medication Sig Start Date End Date Taking? Authorizing Provider  benzonatate (TESSALON) 100 MG capsule Take 1 capsule (100 mg total) by mouth every 8 (eight) hours. 12/25/17   Tasia Catchings, Lexxi Koslow V, PA-C  Cetirizine HCl (ZYRTEC PO) Take by mouth.    [provider]  GABAPENTIN PO Take by mouth.    [provider]  ibuprofen (ADVIL,MOTRIN) 800 MG tablet Take 1 tablet (800 mg total) by mouth 3 (three) times daily. 01/19/13   Luvenia Redden, PA-C  ipratropium (ATROVENT) 0.06 % nasal spray Place 2 sprays into both nostrils 4 (four) times daily. 12/25/17   Tasia Catchings, Myranda Pavone V, PA-C  Montelukast Sodium (SINGULAIR PO) Take by mouth.    [provider]  naproxen (NAPROSYN) 500 MG tablet  Take 1 tablet (500 mg total) by mouth 2 (two) times daily. 11/08/15   Jacqualine Mau, NP  ondansetron (ZOFRAN ODT) 4 MG disintegrating tablet Take 1 tablet (4 mg total) by mouth every 8 (eight) hours as needed for nausea or vomiting. 12/25/17   Tasia Catchings, Gray Maugeri V, PA-C  oxyCODONE-acetaminophen (PERCOCET/ROXICET) 5-325 MG per tablet Take 2 tablets by mouth every 4 (four) hours as needed for severe pain. 01/19/13   Luvenia Redden, PA-C  Prenatal Vit-Fe Fumarate-FA (PRENATAL MULTIVITAMIN) TABS tablet Take 1 tablet by mouth daily at 12 noon.    [provider]  promethazine (PHENERGAN) 25 MG tablet Take 1 tablet (25 mg total) by mouth every 6 (six) hours as needed for nausea or vomiting. 01/19/13   Luvenia Redden, PA-C  UNKNOWN TO PATIENT BCPs    [provider]    Family History Family History  Problem Relation Age of Onset  . Diabetes Maternal Grandmother     Social History Social History   Tobacco Use  . Smoking status: Former Research scientist (life sciences)  . Smokeless tobacco: Never Used  Substance Use Topics  . Alcohol use: Yes    Comment: occasional  . Drug use: No     Allergies   Patient has no known allergies.   Review of Systems Review of Systems  Reason unable to perform ROS: See HPI as above.     Physical Exam Triage Vital Signs ED Triage Vitals  Enc Vitals Group     BP 12/25/17 1514 140/88     Pulse Rate 12/25/17 1514 (!) 115     Resp 12/25/17 1514 20     Temp 12/25/17 1514 99.4 F (37.4 C)     Temp Source 12/25/17 1514 Oral     SpO2 12/25/17 1514 100 %     Weight --      Height --      Head Circumference --      Peak Flow --      Pain Score 12/25/17 1515 6     Pain Loc --      Pain Edu? --      Excl. in [place]? --    No data found.  Updated Vital Signs BP 140/88 (BP Location: Left Arm)   Pulse (!) 115   Temp 99.4 F (37.4 C) (Oral)   Resp 20   SpO2 100%   Physical Exam  Constitutional: She is oriented to person, place, and time. She appears  well-developed and well-nourished. No distress.  HENT:  Head: Normocephalic and atraumatic.  Right Ear: Tympanic membrane, external ear and ear canal normal. Tympanic membrane is not erythematous and not bulging.  Left Ear: Tympanic membrane, external ear and ear canal normal. Tympanic membrane is not erythematous and not bulging.  Nose: Nose normal. Right sinus exhibits no maxillary sinus tenderness and no frontal sinus tenderness. Left sinus exhibits no maxillary sinus tenderness and no frontal sinus tenderness.  Mouth/Throat: Uvula is midline, oropharynx is clear and moist and mucous membranes are normal.  Eyes: Pupils are equal, round, and reactive to light. Conjunctivae are normal.  Neck: Normal range of motion. Neck supple.  Cardiovascular: Regular rhythm and normal heart sounds. Tachycardia present. Exam reveals no gallop and no friction rub.  No murmur heard. Pulmonary/Chest: Effort normal and breath sounds normal. No stridor. No respiratory distress. She has no decreased breath sounds. She has no wheezes. She has no rhonchi. She has no rales.  Abdominal: Soft. Bowel sounds are normal. She exhibits no mass. There is no tenderness. There is no rebound, no guarding and no CVA tenderness.  Lymphadenopathy:    She has no cervical adenopathy.  Neurological: She is alert and oriented to person, place, and time.  Skin: Skin is warm and dry. She is not diaphoretic.  Psychiatric: She has a normal mood and affect. Her behavior is normal. Judgment normal.     UC Treatments / Results  Labs (all labs ordered are listed, but only abnormal results are displayed) Labs Reviewed - No data to display  EKG None  Radiology No results found.  Procedures Procedures (including critical care time)  Medications Ordered in UC Medications  ondansetron (ZOFRAN-ODT) disintegrating tablet 4 mg (has no administration in time range)    Initial Impression / Assessment and Plan / UC Course  I have  reviewed the triage vital signs and the nursing notes.  Pertinent labs & imaging results that were available during my care of the patient were reviewed by me and considered in my medical decision making (see chart for details).    Discussed with patient history and  exam most consistent with viral URI. Patient nontoxic in appearance. Will provide zofran for nausea/vomiting. Other symptomatic treatment as needed. Push fluids. Return precautions given.   Final Clinical Impressions(s) / UC Diagnoses   Final diagnoses:  Viral illness    ED Prescriptions    Medication Sig Dispense Auth. Provider   ondansetron (ZOFRAN ODT) 4 MG disintegrating tablet Take 1 tablet (4 mg total) by mouth every 8 (eight) hours as needed for nausea or vomiting. 20 tablet Camora Tremain V, PA-C   ipratropium (ATROVENT) 0.06 % nasal spray Place 2 sprays into both nostrils 4 (four) times daily. 15 mL Eden Rho V, PA-C   benzonatate (TESSALON) 100 MG capsule Take 1 capsule (100 mg total) by mouth every 8 (eight) hours. 21 capsule Tobin Chad, PA-C 12/25/17 1600

## 2017-12-25 NOTE — Discharge Instructions (Addendum)
Tessalon for cough. Zofran for nausea/vomiting. Start atrovent nasal spray for nasal congestion/drainage. You can use over the counter nasal saline rinse such as neti pot for nasal congestion. Keep hydrated, your urine should be clear to pale yellow in color. Tylenol/motrin for fever and pain. Monitor for any worsening of symptoms, chest pain, shortness of breath, wheezing, swelling of the throat, follow up for reevaluation.   For sore throat/cough try using a honey-based tea. Use 3 teaspoons of honey with juice squeezed from half lemon. Place shaved pieces of ginger into 1/2-1 cup of water and warm over stove top. Then mix the ingredients and repeat every 4 hours as needed.

## 2017-12-25 NOTE — ED Triage Notes (Signed)
Pt here for fever and body aches and vomiting x 1 starting yesterday

## 2018-03-22 ENCOUNTER — Ambulatory Visit: Payer: BC Managed Care – PPO | Admitting: Primary Care

## 2018-03-22 ENCOUNTER — Encounter: Payer: Self-pay | Admitting: Primary Care

## 2018-03-22 DIAGNOSIS — G43709 Chronic migraine without aura, not intractable, without status migrainosus: Secondary | ICD-10-CM

## 2018-03-22 DIAGNOSIS — G43909 Migraine, unspecified, not intractable, without status migrainosus: Secondary | ICD-10-CM | POA: Insufficient documentation

## 2018-03-22 DIAGNOSIS — R519 Headache, unspecified: Secondary | ICD-10-CM

## 2018-03-22 DIAGNOSIS — R51 Headache: Secondary | ICD-10-CM | POA: Diagnosis not present

## 2018-03-22 DIAGNOSIS — Z87898 Personal history of other specified conditions: Secondary | ICD-10-CM | POA: Diagnosis not present

## 2018-03-22 MED ORDER — SUMATRIPTAN SUCCINATE 25 MG PO TABS
ORAL_TABLET | ORAL | 0 refills | Status: DC
Start: 2018-03-22 — End: 2020-06-04

## 2018-03-22 MED ORDER — SCOPOLAMINE 1 MG/3DAYS TD PT72: MEDICATED_PATCH | TRANSDERMAL | 0 refills | Status: DC

## 2018-03-22 NOTE — Assessment & Plan Note (Signed)
Occur 1-2 times weekly on average and are overall mild. Uses Tylenol with resolve.  She does not wish to resume daily preventative treatment, this seems reasonable given the nature of her headaches. Continue Tylenol. She will update.

## 2018-03-22 NOTE — Patient Instructions (Addendum)
You may use the sumatriptan (Imitrex) tablets as needed for bad migraines. Take 1 tablet by mouth at migraine onset. May repeat in 2 hours if headache persists or recurs.  Continue Tylenol as needed for headaches.  Apply the scopolamine patch to a hairless area four hours prior to starting your cruise. Rotate sites every 3 days.  It was a pleasure to meet you today! Please don't hesitate to call or message me with any questions. Welcome to [facility]!

## 2018-03-22 NOTE — Assessment & Plan Note (Signed)
Occur once monthly on average, usually able to taking Tylenol and sleep off. She is interested in abortive treatment.  Rx for sumatriptan 25 mg sent to pharmacy. Discussed instructions for use.
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
## 2018-03-22 NOTE — Assessment & Plan Note (Signed)
Does well with scopolamine patches PRN. Will be going on a 9 day cruise in April 2020. Rx sent to pharmacy. Discussed directions for use.

## 2018-06-25 ENCOUNTER — Encounter: Payer: Self-pay | Admitting: Primary Care

## 2018-06-25 ENCOUNTER — Ambulatory Visit (INDEPENDENT_AMBULATORY_CARE_PROVIDER_SITE_OTHER): Payer: BC Managed Care – PPO | Admitting: Primary Care

## 2018-06-25 VITALS — Wt 202.0 lb

## 2018-06-25 DIAGNOSIS — J302 Other seasonal allergic rhinitis: Secondary | ICD-10-CM | POA: Diagnosis not present

## 2018-06-25 MED ORDER — CETIRIZINE HCL 10 MG PO TABS: 10.0000 mg | ORAL_TABLET | Freq: Every day | ORAL | 0 refills | Status: DC

## 2018-06-25 MED ORDER — MONTELUKAST SODIUM 10 MG PO TABS: 10.0000 mg | ORAL_TABLET | Freq: Every day | ORAL | 0 refills | Status: DC

## 2018-06-25 NOTE — Assessment & Plan Note (Signed)
Chronic, this year worse than prior years. Given severity of symptoms, will resume her prior regimen of Singulair and Zyrtec. She has done well with this in the past. Also discussed use of nasal sprays. She will update in 1-2 weeks.

## 2018-06-25 NOTE — Patient Instructions (Signed)
Start Zyrtec and Singulair for allergy symptoms as discussed.  Please update me in 1-2 weeks regarding your symptoms.  It was a pleasure to see you today!
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
## 2018-09-16 ENCOUNTER — Other Ambulatory Visit: Payer: Self-pay | Admitting: Primary Care

## 2018-09-16 DIAGNOSIS — J302 Other seasonal allergic rhinitis: Secondary | ICD-10-CM

## 2018-12-17 ENCOUNTER — Ambulatory Visit (INDEPENDENT_AMBULATORY_CARE_PROVIDER_SITE_OTHER)
Admission: RE | Admit: 2018-12-17 | Discharge: 2018-12-17 | Disposition: A | Discharge status: Home / Self Care | Payer: BC Managed Care – PPO | Source: Ambulatory Visit

## 2018-12-17 DIAGNOSIS — J069 Acute upper respiratory infection, unspecified: Secondary | ICD-10-CM

## 2018-12-17 MED ORDER — BENZONATATE 100 MG PO CAPS: 100.0000 mg | ORAL_CAPSULE | Freq: Three times a day (TID) | ORAL | 0 refills | Status: DC

## 2018-12-17 NOTE — Discharge Instructions (Addendum)
Take the cough medication as directed.    Come here to be seen in person or follow-up with your primary care provider if your symptoms are not improving or get worse.

## 2018-12-17 NOTE — ED Provider Notes (Signed)
Virtual Visit via Video Note:  Cassidy Thompson  initiated request for Telemedicine visit with [facility] Urgent [facility] team. I connected with Cassidy Thompson  on 12/17/2018 at 1:45 PM  for a synchronized telemedicine visit using a video enabled HIPPA compliant telemedicine application. I verified that I am speaking with Cassidy Thompson  using two identifiers. Cassidy Balloon, NP  was physically located in a [facility] Urgent care site and Cassidy Thompson was located at a different location.   The limitations of evaluation and management by telemedicine as well as the availability of in-person appointments were discussed. Patient was informed that she  may incur a bill ( including co-pay) for this virtual visit encounter. Cassidy Thompson  expressed understanding and gave verbal consent to proceed with virtual visit.     History of Present Illness:Cassidy Thompson  is a 28 y.o. female presents for evaluation of 5-day history of fever, sore throat, cough productive of green mucus, sinus congestion.  T-max 101.7 but no fever for the past 3 days.  She has been treating her symptoms at home with Tylenol.  She states she had a Covid test done on 12/15/2018 which was negative.  She denies rash, shortness of breath, vomiting, diarrhea, or other symptoms.  LMP: 2 weeks.  Patient states her symptoms have been progressively getting better each day.   No Known Allergies   Past Medical History:  Diagnosis Date  . Chronic headaches   . Closed TBI (traumatic brain injury) ([place]) 2011  . Seasonal allergies      Social History   Tobacco Use  . Smoking status: Former Research scientist (life sciences)  . Smokeless tobacco: Never Used  Substance Use Topics  . Alcohol use: Yes    Comment: occasional  . Drug use: No        Observations/Objective: Physical Exam  VITALS: Patient denies fever for the past 3 days. GENERAL: Alert, appears well and in no acute distress. HEENT: Atraumatic. NECK: Normal movements of the head and  neck. CARDIOPULMONARY: No increased WOB. Speaking in clear sentences. I:E ratio WNL.  MS: Moves all visible extremities without noticeable abnormality. PSYCH: Pleasant and cooperative, well-groomed. Speech normal rate and rhythm. Affect is appropriate. Insight and judgement are appropriate. Attention is focused, linear, and appropriate.  NEURO: CN grossly intact. Oriented as arrived to appointment on time with no prompting. Moves both UE equally.     Assessment and Plan:    ICD-10-CM   1. Viral URI with cough  J06.9        Follow Up Instructions: Instructed patient to continue taking Tylenol as needed for fever or discomfort.  Treating her cough with Tessalon Perles.  Discussed with patient that she will need to be seen in person either by her PCP or here if her symptoms do not continue to improve.  Discussed that she should be seen if she has fever, sore throat, worsening cough, or other concerns.  Patient agrees to plan of care.    I discussed the assessment and treatment plan with the patient. The patient was provided an opportunity to ask questions and all were answered. The patient agreed with the plan and demonstrated an understanding of the instructions.   The patient was advised to call back or seek an in-person evaluation if the symptoms worsen or if the condition fails to improve as anticipated.      Cassidy Balloon, NP  12/17/2018 1:45 PM  Cassidy Balloon, NP 12/17/18 1348

## 2019-01-01 NOTE — Addendum Note (Signed)
Encounter addended by: Sharion Balloon, NP on: 01/01/2019 2:16 PM  Actions taken: Letter saved

## 2019-08-05 ENCOUNTER — Other Ambulatory Visit: Payer: Self-pay | Admitting: Primary Care

## 2019-08-05 DIAGNOSIS — J302 Other seasonal allergic rhinitis: Secondary | ICD-10-CM

## 2020-02-08 NOTE — L&D Delivery Note (Signed)
Delivery Note  Mother pushed for an hour after she was noted to be C/C/+1.   At 8:29 AM a viable and healthy female was delivered via Vaginal, Spontaneous (Presentation: Right Occiput Anterior) after a small midline episiotomy was cut.   APGAR: 8, 9; weight pending.  Baby laid on maternal abdomen and was noted to be vigorously crying and moving all four extremities.  Delayed cord clamping done and cut by father. Placenta spontaneously delivered intact, 3 vessels noted, marginal insertion confirmed.  Uterine atony was present and one dose of TXA and IM methergine was administered. IV pitocin and massage also was simultaneously done and hemostasis achieved.  Second degree episiotomy repaired in routine fashion with 2-0 vicryl and small right upper labia minora laceration repaired with 4-0 vicryl.   Patient tolerated delivery well, there were no complications.     Anesthesia: Epidural Episiotomy: Median Lacerations: 2nd degree Suture Repair: 2.0 vicryl Est. Blood Loss (mL):  300  Mom to postpartum.  Baby to Couplet care / Skin to Skin.  Allean Montfort 12/27/2020, 9:35 AM

## 2020-05-25 LAB — OB RESULTS CONSOLE GC/CHLAMYDIA: Chlamydia: NEGATIVE

## 2020-05-25 LAB — OB RESULTS CONSOLE RUBELLA ANTIBODY, IGM: Rubella: IMMUNE

## 2020-05-25 LAB — OB RESULTS CONSOLE HIV ANTIBODY (ROUTINE TESTING): HIV: NONREACTIVE

## 2020-05-25 LAB — OB RESULTS CONSOLE HEPATITIS B SURFACE ANTIGEN: Hepatitis B Surface Ag: NEGATIVE

## 2020-05-25 LAB — HM PAP SMEAR

## 2020-05-25 LAB — RESULTS CONSOLE HPV: CHL HPV: NEGATIVE

## 2020-06-04 ENCOUNTER — Other Ambulatory Visit: Payer: Self-pay

## 2020-06-04 ENCOUNTER — Inpatient Hospital Stay (HOSPITAL_COMMUNITY)
Admission: AD | Admit: 2020-06-04 | Discharge: 2020-06-04 | Disposition: A | Discharge status: Home / Self Care | Payer: BC Managed Care – PPO | Attending: Obstetrics & Gynecology | Admitting: Obstetrics & Gynecology

## 2020-06-04 ENCOUNTER — Encounter (HOSPITAL_COMMUNITY): Payer: Self-pay | Admitting: Obstetrics & Gynecology

## 2020-06-04 DIAGNOSIS — Z87891 Personal history of nicotine dependence: Secondary | ICD-10-CM | POA: Diagnosis not present

## 2020-06-04 DIAGNOSIS — Z3A10 10 weeks gestation of pregnancy: Secondary | ICD-10-CM | POA: Insufficient documentation

## 2020-06-04 DIAGNOSIS — O219 Vomiting of pregnancy, unspecified: Secondary | ICD-10-CM | POA: Insufficient documentation

## 2020-06-04 DIAGNOSIS — E860 Dehydration: Secondary | ICD-10-CM | POA: Diagnosis not present

## 2020-06-04 DIAGNOSIS — O99281 Endocrine, nutritional and metabolic diseases complicating pregnancy, first trimester: Secondary | ICD-10-CM | POA: Insufficient documentation

## 2020-06-04 LAB — CBC
HCT: 39 % (ref 36.0–46.0)
Hemoglobin: 13.4 g/dL (ref 12.0–15.0)
MCH: 31.6 pg (ref 26.0–34.0)
MCHC: 34.4 g/dL (ref 30.0–36.0)
MCV: 92 fL (ref 80.0–100.0)
Platelets: 255 10*3/uL (ref 150–400)
RBC: 4.24 MIL/uL (ref 3.87–5.11)
RDW: 11.9 % (ref 11.5–15.5)
WBC: 8.5 10*3/uL (ref 4.0–10.5)
nRBC: 0 % (ref 0.0–0.2)

## 2020-06-04 LAB — URINALYSIS, ROUTINE W REFLEX MICROSCOPIC
Bilirubin Urine: NEGATIVE
Glucose, UA: NEGATIVE mg/dL
Hgb urine dipstick: NEGATIVE
Ketones, ur: NEGATIVE mg/dL
Nitrite: NEGATIVE
Protein, ur: NEGATIVE mg/dL
Specific Gravity, Urine: 1.019 (ref 1.005–1.030)
pH: 7 (ref 5.0–8.0)

## 2020-06-04 LAB — COMPREHENSIVE METABOLIC PANEL
ALT: 35 U/L (ref 0–44)
AST: 24 U/L (ref 15–41)
Albumin: 3.9 g/dL (ref 3.5–5.0)
Alkaline Phosphatase: 70 U/L (ref 38–126)
Anion gap: 8 (ref 5–15)
BUN: 5 mg/dL — ABNORMAL LOW (ref 6–20)
CO2: 24 mmol/L (ref 22–32)
Calcium: 9.4 mg/dL (ref 8.9–10.3)
Chloride: 102 mmol/L (ref 98–111)
Creatinine, Ser: 0.61 mg/dL (ref 0.44–1.00)
GFR, Estimated: >60 mL/min (ref >60)
Glucose, Bld: 86 mg/dL (ref 70–99)
Potassium: 3.9 mmol/L (ref 3.5–5.1)
Sodium: 134 mmol/L — ABNORMAL LOW (ref 135–145)
Total Bilirubin: 0.7 mg/dL (ref 0.3–1.2)
Total Protein: 7.5 g/dL (ref 6.5–8.1)

## 2020-06-04 MED ORDER — METOCLOPRAMIDE HCL 5 MG/ML IJ SOLN
10.0000 mg | Freq: Once | INTRAMUSCULAR | Status: AC
  Administered 2020-06-04: 10 mg via INTRAVENOUS
  Filled 2020-06-04: qty 2

## 2020-06-04 MED ORDER — SCOPOLAMINE 1 MG/3DAYS TD PT72
1.0000 | MEDICATED_PATCH | TRANSDERMAL | Status: DC
  Administered 2020-06-04: 1.5 mg via TRANSDERMAL
  Filled 2020-06-04: qty 1

## 2020-06-04 MED ORDER — ONDANSETRON 4 MG PO TBDP: 4.0000 mg | ORAL_TABLET | Freq: Four times a day (QID) | ORAL | 0 refills | Status: DC | PRN

## 2020-06-04 MED ORDER — LACTATED RINGERS IV BOLUS: 1000.0000 mL | Freq: Once | INTRAVENOUS | Status: DC

## 2020-06-04 MED ORDER — FAMOTIDINE IN NACL 20-0.9 MG/50ML-% IV SOLN
20.0000 mg | Freq: Once | INTRAVENOUS | Status: AC
  Administered 2020-06-04: 20 mg via INTRAVENOUS
  Filled 2020-06-04: qty 50

## 2020-06-04 NOTE — MAU Provider Note (Incomplete Revision)
History     CSN: [id number]  Arrival date and time: 06/04/20 5784   Event Date/Time   First Provider Initiated Contact with Patient 06/04/20 2041      Chief Complaint  Patient presents with  . Nausea  . Emesis   30 y.o. G2P0 @10 .4 wks presenting with N/V. Report onset 3 weeks ago. States she cannot tolerate anything. Reports 10-15 lbs weight loss. Denies sick contacts. No diarrhea. Has tried Phenergan but it doesn't help. Denies abd pain and VB.   OB History    Gravida  2   Para      Term      Preterm      AB      Living        SAB      IAB      Ectopic      Multiple      Live Births              Past Medical History:  Diagnosis Date  . Chronic headaches   . Closed TBI (traumatic brain injury) ([place]) 2011  . Seasonal allergies     Past Surgical History:  Procedure Laterality Date  . WISDOM TOOTH EXTRACTION      Family History  Problem Relation Age of Onset  . Kidney Stones Mother   . Diabetes Maternal Grandmother   . Heart attack Maternal Grandfather     Social History   Tobacco Use  . Smoking status: Former Research scientist (life sciences)  . Smokeless tobacco: Never Used  Substance Use Topics  . Alcohol use: Yes    Comment: occasional  . Drug use: No    Allergies: No Known Allergies  Medications Prior to Admission  Medication Sig Dispense Refill Last Dose  . benzonatate (TESSALON) 100 MG capsule Take 1 capsule (100 mg total) by mouth every 8 (eight) hours. 21 capsule 0   . cetirizine (ZYRTEC) 10 MG tablet TAKE 1 TABLET (10 MG TOTAL) BY MOUTH DAILY. FOR ALLERGIES. 90 tablet 0   . montelukast (SINGULAIR) 10 MG tablet TAKE 1 TABLET (10 MG TOTAL) BY MOUTH AT BEDTIME. FOR ALLERGIES. 90 tablet 0   . SUMAtriptan (IMITREX) 25 MG tablet Take 1 tablet by mouth at migraine onset. May repeat in 2 hours if headache persists or recurs. (Patient not taking: Reported on 06/25/2018) 10 tablet 0     Review of Systems  Constitutional: Negative for chills and fever.   Gastrointestinal: Positive for nausea and vomiting. Negative for abdominal pain and diarrhea.  Genitourinary: Negative for vaginal bleeding.   Physical Exam   Blood pressure 118/75, pulse (!) 101, temperature 98 F (36.7 C), temperature source Oral, resp. rate 16, height 5\' 7"  (1.702 m), weight 79.1 kg, SpO2 97 %, unknown if currently breastfeeding.  Physical Exam Vitals and nursing note reviewed.  Constitutional:      General: She is not in acute distress.    Appearance: Normal appearance.  HENT:     Head: Normocephalic and atraumatic.  Pulmonary:     Effort: Pulmonary effort is normal. No respiratory distress.  Musculoskeletal:     Cervical back: Normal range of motion.  Neurological:     General: No focal deficit present.     Mental Status: She is alert and oriented to person, place, and time.  Psychiatric:        Mood and Affect: Mood normal.        Behavior: Behavior normal.   FHT 188  Results for orders placed or  performed during the hospital encounter of 06/04/20 (from the past 24 hour(s))  Urinalysis, Routine w reflex microscopic Urine, Clean Catch     Status: Abnormal   Collection Time: 06/04/20  5:19 PM  Result Value Ref Range   Color, Urine YELLOW YELLOW   APPearance HAZY (A) CLEAR   Specific Gravity, Urine 1.019 1.005 - 1.030   pH 7.0 5.0 - 8.0   Glucose, UA NEGATIVE NEGATIVE mg/dL   Hgb urine dipstick NEGATIVE NEGATIVE   Bilirubin Urine NEGATIVE NEGATIVE   Ketones, ur NEGATIVE NEGATIVE mg/dL   Protein, ur NEGATIVE NEGATIVE mg/dL   Nitrite NEGATIVE NEGATIVE   Leukocytes,Ua LARGE (A) NEGATIVE   RBC / HPF 0-5 0 - 5 RBC/hpf   WBC, UA 0-5 0 - 5 WBC/hpf   Bacteria, UA MANY (A) NONE SEEN   Squamous Epithelial / LPF 6-10 0 - 5   Mucus PRESENT    Non Squamous Epithelial 0-5 (A) NONE SEEN  Comprehensive metabolic panel     Status: Abnormal   Collection Time: 06/04/20  5:20 PM  Result Value Ref Range   Sodium 134 (L) 135 - 145 mmol/L   Potassium 3.9 3.5 - 5.1  mmol/L   Chloride 102 98 - 111 mmol/L   CO2 24 22 - 32 mmol/L   Glucose, Bld 86 70 - 99 mg/dL   BUN 5 (L) 6 - 20 mg/dL   Creatinine, Ser 0.61 0.44 - 1.00 mg/dL   Calcium 9.4 8.9 - 10.3 mg/dL   Total Protein 7.5 6.5 - 8.1 g/dL   Albumin 3.9 3.5 - 5.0 g/dL   AST 24 15 - 41 U/L   ALT 35 0 - 44 U/L   Alkaline Phosphatase 70 38 - 126 U/L   Total Bilirubin 0.7 0.3 - 1.2 mg/dL   GFR, Estimated >60 >60 mL/min   Anion gap 8 5 - 15   MAU Course  Procedures  MDM Labs and meds ordered.  Transfer of care given to Leslye Peer, [place]  06/04/2020 8:51 PM    Assessment and Plan

## 2020-06-04 NOTE — MAU Provider Note (Addendum)
History     CSN: [id number]  Arrival date and time: 06/04/20 5956   Event Date/Time   First Provider Initiated Contact with Patient 06/04/20 2041      Chief Complaint  Patient presents with  . Nausea  . Emesis   30 y.o. G2P0 @10 .4 wks presenting with N/V. Report onset 3 weeks ago. States she cannot tolerate anything. Reports 10-15 lbs weight loss. Denies sick contacts. No diarrhea. Has tried Phenergan but it doesn't help. Denies abd pain and VB.   OB History    Gravida  2   Para      Term      Preterm      AB      Living        SAB      IAB      Ectopic      Multiple      Live Births              Past Medical History:  Diagnosis Date  . Chronic headaches   . Closed TBI (traumatic brain injury) ([place]) 2011  . Seasonal allergies     Past Surgical History:  Procedure Laterality Date  . WISDOM TOOTH EXTRACTION      Family History  Problem Relation Age of Onset  . Kidney Stones Mother   . Diabetes Maternal Grandmother   . Heart attack Maternal Grandfather     Social History   Tobacco Use  . Smoking status: Former Research scientist (life sciences)  . Smokeless tobacco: Never Used  Substance Use Topics  . Alcohol use: Yes    Comment: occasional  . Drug use: No    Allergies: No Known Allergies  Medications Prior to Admission  Medication Sig Dispense Refill Last Dose  . benzonatate (TESSALON) 100 MG capsule Take 1 capsule (100 mg total) by mouth every 8 (eight) hours. 21 capsule 0   . cetirizine (ZYRTEC) 10 MG tablet TAKE 1 TABLET (10 MG TOTAL) BY MOUTH DAILY. FOR ALLERGIES. 90 tablet 0   . montelukast (SINGULAIR) 10 MG tablet TAKE 1 TABLET (10 MG TOTAL) BY MOUTH AT BEDTIME. FOR ALLERGIES. 90 tablet 0   . SUMAtriptan (IMITREX) 25 MG tablet Take 1 tablet by mouth at migraine onset. May repeat in 2 hours if headache persists or recurs. (Patient not taking: Reported on 06/25/2018) 10 tablet 0     Review of Systems  Constitutional: Negative for chills and fever.   Gastrointestinal: Positive for nausea and vomiting. Negative for abdominal pain and diarrhea.  Genitourinary: Negative for vaginal bleeding.   Physical Exam   Blood pressure 118/75, pulse (!) 101, temperature 98 F (36.7 C), temperature source Oral, resp. rate 16, height 5\' 7"  (1.702 m), weight 79.1 kg, SpO2 97 %, unknown if currently breastfeeding.  Physical Exam Vitals and nursing note reviewed.  Constitutional:      General: She is not in acute distress.    Appearance: Normal appearance.  HENT:     Head: Normocephalic and atraumatic.  Pulmonary:     Effort: Pulmonary effort is normal. No respiratory distress.  Musculoskeletal:     Cervical back: Normal range of motion.  Neurological:     General: No focal deficit present.     Mental Status: She is alert and oriented to person, place, and time.  Psychiatric:        Mood and Affect: Mood normal.        Behavior: Behavior normal.   FHT 188  Results for orders placed or  performed during the hospital encounter of 06/04/20 (from the past 24 hour(s))  Urinalysis, Routine w reflex microscopic Urine, Clean Catch     Status: Abnormal   Collection Time: 06/04/20  5:19 PM  Result Value Ref Range   Color, Urine YELLOW YELLOW   APPearance HAZY (A) CLEAR   Specific Gravity, Urine 1.019 1.005 - 1.030   pH 7.0 5.0 - 8.0   Glucose, UA NEGATIVE NEGATIVE mg/dL   Hgb urine dipstick NEGATIVE NEGATIVE   Bilirubin Urine NEGATIVE NEGATIVE   Ketones, ur NEGATIVE NEGATIVE mg/dL   Protein, ur NEGATIVE NEGATIVE mg/dL   Nitrite NEGATIVE NEGATIVE   Leukocytes,Ua LARGE (A) NEGATIVE   RBC / HPF 0-5 0 - 5 RBC/hpf   WBC, UA 0-5 0 - 5 WBC/hpf   Bacteria, UA MANY (A) NONE SEEN   Squamous Epithelial / LPF 6-10 0 - 5   Mucus PRESENT    Non Squamous Epithelial 0-5 (A) NONE SEEN  Comprehensive metabolic panel     Status: Abnormal   Collection Time: 06/04/20  5:20 PM  Result Value Ref Range   Sodium 134 (L) 135 - 145 mmol/L   Potassium 3.9 3.5 - 5.1  mmol/L   Chloride 102 98 - 111 mmol/L   CO2 24 22 - 32 mmol/L   Glucose, Bld 86 70 - 99 mg/dL   BUN 5 (L) 6 - 20 mg/dL   Creatinine, Ser 0.61 0.44 - 1.00 mg/dL   Calcium 9.4 8.9 - 10.3 mg/dL   Total Protein 7.5 6.5 - 8.1 g/dL   Albumin 3.9 3.5 - 5.0 g/dL   AST 24 15 - 41 U/L   ALT 35 0 - 44 U/L   Alkaline Phosphatase 70 38 - 126 U/L   Total Bilirubin 0.7 0.3 - 1.2 mg/dL   GFR, Estimated >60 >60 mL/min   Anion gap 8 5 - 15   MAU Course  Procedures  MDM Labs and meds ordered.  Transfer of care given to Leslye Peer, [place]  06/04/2020 8:51 PM    Felt better after treatment States just ran out of her Phenergan which has worked well for her so far Will prescribe new supply Will Rx Zofran for refractory vomiting prn.  Assessment and Plan  A:  [facility] IUP at [redacted]w[redacted]d       Nausea and vomiting      Mild dehydration  P:  Discharge home      Rx Phenergan for prn use nausea       Advance diet slowly       Followup in office at [place]   Encouraged to return if she develops worsening of symptoms, increase in pain, fever, or other concerning symptoms.   Seabron Spates, CNM

## 2020-06-04 NOTE — Discharge Instructions (Signed)
Hyperemesis Gravidarum Hyperemesis gravidarum is a severe form of nausea and vomiting that happens during pregnancy. Hyperemesis is worse than morning sickness. It may cause you to have nausea or vomiting all day for many days. It may keep you from eating and drinking enough food and liquids, which can lead to dehydration, malnutrition, and weight loss. Hyperemesis usually occurs during the first half (the first 20 weeks) of pregnancy. It often goes away once a woman is in her second half of pregnancy. However, sometimes hyperemesis continues through an entire pregnancy. What are the causes? The cause of this condition is not known. It may be associated with:  Changes in hormones in the body during pregnancy.  Changes in the gastrointestinal system.  Genetic or inherited conditions. What are the signs or symptoms? Symptoms of this condition include:  Severe nausea and vomiting that does not go away.  Problems keeping food down.  Weight loss.  Loss of body fluid (dehydration).  Loss of appetite. You may have no desire to eat or you may not like the food you have previously enjoyed. How is this diagnosed? This condition may be diagnosed based on your medical history, your symptoms, and a physical exam. You may also have other tests, including:  Blood tests.  Urine tests.  Blood pressure tests.  Ultrasound to look for problems with the placenta or to check if you are pregnant with more than one baby. How is this treated? This condition is managed by controlling symptoms. This may include:  Following an eating plan. This can help to lessen nausea and vomiting.  Treatments that do not use medicine. These include acupressure bracelets, hypnosis, and eating or drinking foods or fluids that contain ginger, ginger ale, or ginger tea.  Taking prescription medicine or over-the-counter medicine as told by your health care provider.  Continuing to take prenatal vitamins. You may need to  change what kind you take and when you take them. Follow your health care provider's instructions about prenatal vitamins. An eating plan and medicines are often used together to help control symptoms. If medicines do not help relieve nausea and vomiting, you may need to receive fluids through an IV at the hospital. Follow these instructions at home: To help relieve your symptoms, listen to your body. Everyone is different and has different preferences. Find what works best for you. Here are some things you can try to help relieve your symptoms: Meals and snacks  Eat 5-6 small meals daily instead of 3 large meals. Eating small meals and snacks can help you avoid an empty stomach.  Before getting out of bed, eat a couple of crackers to avoid moving around on an empty stomach.  Eat a protein-rich snack before bed. Examples include cheese and crackers, or a peanut butter sandwich made with 1 slice of whole-wheat bread and 1 tsp (5 g) of peanut butter.  Eat and drink slowly.  Try eating starchy foods as these are usually tolerated well. Examples include cereal, toast, bread, potatoes, pasta, rice, and pretzels.  Eat at least one serving of protein with your meals and snacks. Protein options include lean meats, poultry, seafood, beans, nuts, nut butters, eggs, cheese, and yogurt.  Eat or suck on things that have ginger in them. It may help to relieve nausea. Add  tsp (0.44 g) ground ginger to hot tea, or choose ginger tea.   Fluids It is important to stay hydrated. Try to:  Drink small amounts of fluids often.  Drink fluids 30 minutes  before or after a meal to help lessen the feeling of a full stomach.  Drink 100% fruit juice or an electrolyte drink. An electrolyte drink contains sodium, potassium, and chloride.  Drink fluids that are cold, clear, and carbonated or sour. These include lemonade, ginger ale, lemon-lime soda, ice water, and sparkling water. Things to avoid Avoid the  following:  Eating foods that trigger your symptoms. These may include spicy foods, coffee, high-fat foods, very sweet foods, and acidic foods.  Drinking more than 1 cup of fluid at a time.  Skipping meals. Nausea can be more intense on an empty stomach. If you cannot tolerate food, do not force it. Try sucking on ice chips or other frozen items and make up for missed calories later.  Lying down within 2 hours after eating.  Being exposed to environmental triggers. These may include food smells, smoky rooms, closed spaces, rooms with strong smells, warm or humid places, overly loud and noisy rooms, and rooms with motion or flickering lights. Try eating meals in a well-ventilated area that is free of strong smells.  Making quick and sudden changes in your movement.  Taking iron pills and multivitamins that contain iron. If you take prescription iron pills, do not stop taking them unless your health care provider approves.  Preparing food. The smell of food can spoil your appetite or trigger nausea. General instructions  Brush your teeth or use a mouth rinse after meals.  Take over-the-counter and prescription medicines only as told by your health care provider.  Follow instructions from your health care provider about eating or drinking restrictions.  Talk with your health care provider about starting a supplement of vitamin B6.  Continue to take your prenatal vitamins as told by your health care provider. If you are having trouble taking your prenatal vitamins, talk with your health care provider about other options.  Keep all follow-up visits. This is important. Follow-up visits include prenatal visits. Contact a health care provider if:  You have pain in your abdomen.  You have a severe headache.  You have vision problems.  You are losing weight.  You feel weak or dizzy.  You cannot eat or drink without vomiting, especially if this goes on for a full day. Get help right  away if:  You cannot drink fluids without vomiting.  You vomit blood.  You have constant nausea and vomiting.  You are very weak.  You faint.  You have a fever and your symptoms suddenly get worse. Summary  Hyperemesis gravidarum is a severe form of nausea and vomiting that happens during pregnancy.  Making some changes to your eating habits may help relieve nausea and vomiting.  This condition may be managed with lifestyle changes and medicines as prescribed by your health care provider.  If medicines do not help relieve nausea and vomiting, you may need to receive fluids through an IV at the hospital. This information is not intended to replace advice given to you by your health care provider. Make sure you discuss any questions you have with your health care provider. Document Revised: 08/19/2019 Document Reviewed: 08/19/2019 Elsevier Patient Education  2021 [employer].

## 2020-06-04 NOTE — MAU Note (Signed)
Cassidy Thompson is a 30 y.o. at [redacted]w[redacted]d here in MAU reporting: states she has not been able to keep anything down. Has been Rx for phenergan and vitamin d but is it not helping anymore. Emesis > 10 in the past 24 hours. No pain, vaginal bleeding, or discharge.   Onset of complaint: ongoing  Pain score: 0/10  Vitals:   06/04/20 1630  BP: 118/75  Pulse: (!) 101  Resp: 16  Temp: 98 F (36.7 C)  SpO2: 97%     FHT: 188  Lab orders placed from triage: UA

## 2020-06-06 LAB — CULTURE, OB URINE

## 2020-09-24 DIAGNOSIS — Z3A26 26 weeks gestation of pregnancy: Secondary | ICD-10-CM | POA: Diagnosis not present

## 2020-09-24 DIAGNOSIS — O4442 Low lying placenta NOS or without hemorrhage, second trimester: Secondary | ICD-10-CM | POA: Diagnosis not present

## 2020-09-24 DIAGNOSIS — Z362 Encounter for other antenatal screening follow-up: Secondary | ICD-10-CM | POA: Diagnosis not present

## 2020-09-24 DIAGNOSIS — Z3491 Encounter for supervision of normal pregnancy, unspecified, first trimester: Secondary | ICD-10-CM | POA: Diagnosis not present

## 2020-09-24 LAB — OB RESULTS CONSOLE RPR: RPR: NONREACTIVE

## 2020-10-22 DIAGNOSIS — Z3A30 30 weeks gestation of pregnancy: Secondary | ICD-10-CM | POA: Diagnosis not present

## 2020-10-22 DIAGNOSIS — O43129 Velamentous insertion of umbilical cord, unspecified trimester: Secondary | ICD-10-CM | POA: Diagnosis not present

## 2020-11-20 DIAGNOSIS — Z3A35 35 weeks gestation of pregnancy: Secondary | ICD-10-CM | POA: Diagnosis not present

## 2020-11-20 DIAGNOSIS — O43129 Velamentous insertion of umbilical cord, unspecified trimester: Secondary | ICD-10-CM | POA: Diagnosis not present

## 2020-11-20 DIAGNOSIS — O3663X0 Maternal care for excessive fetal growth, third trimester, not applicable or unspecified: Secondary | ICD-10-CM | POA: Diagnosis not present

## 2020-11-20 DIAGNOSIS — Z113 Encounter for screening for infections with a predominantly sexual mode of transmission: Secondary | ICD-10-CM | POA: Diagnosis not present

## 2020-11-25 LAB — OB RESULTS CONSOLE GC/CHLAMYDIA
Chlamydia: NEGATIVE
Gonorrhea: NEGATIVE

## 2020-12-04 DIAGNOSIS — Z3483 Encounter for supervision of other normal pregnancy, third trimester: Secondary | ICD-10-CM | POA: Diagnosis not present

## 2020-12-08 LAB — OB RESULTS CONSOLE GBS: GBS: NEGATIVE

## 2020-12-18 DIAGNOSIS — O43129 Velamentous insertion of umbilical cord, unspecified trimester: Secondary | ICD-10-CM | POA: Diagnosis not present

## 2020-12-18 DIAGNOSIS — Z3A39 39 weeks gestation of pregnancy: Secondary | ICD-10-CM | POA: Diagnosis not present

## 2020-12-22 ENCOUNTER — Other Ambulatory Visit: Payer: Self-pay | Admitting: Obstetrics & Gynecology

## 2020-12-25 ENCOUNTER — Inpatient Hospital Stay (HOSPITAL_COMMUNITY): Payer: BC Managed Care – PPO

## 2020-12-26 ENCOUNTER — Other Ambulatory Visit: Payer: Self-pay

## 2020-12-26 ENCOUNTER — Inpatient Hospital Stay (HOSPITAL_COMMUNITY)
Admission: AD | Admit: 2020-12-26 | Discharge: 2020-12-28 | DRG: 807 | Disposition: A | Discharge status: Home / Self Care | Payer: BC Managed Care – PPO | Attending: Obstetrics & Gynecology | Admitting: Obstetrics & Gynecology

## 2020-12-26 ENCOUNTER — Encounter (HOSPITAL_COMMUNITY): Payer: Self-pay | Admitting: Obstetrics & Gynecology

## 2020-12-26 DIAGNOSIS — Z3A39 39 weeks gestation of pregnancy: Secondary | ICD-10-CM | POA: Diagnosis not present

## 2020-12-26 DIAGNOSIS — Z23 Encounter for immunization: Secondary | ICD-10-CM | POA: Diagnosis not present

## 2020-12-26 DIAGNOSIS — D251 Intramural leiomyoma of uterus: Secondary | ICD-10-CM | POA: Diagnosis present

## 2020-12-26 DIAGNOSIS — Z20822 Contact with and (suspected) exposure to covid-19: Secondary | ICD-10-CM | POA: Diagnosis present

## 2020-12-26 DIAGNOSIS — O43123 Velamentous insertion of umbilical cord, third trimester: Secondary | ICD-10-CM | POA: Diagnosis not present

## 2020-12-26 DIAGNOSIS — Z3A40 40 weeks gestation of pregnancy: Secondary | ICD-10-CM | POA: Diagnosis not present

## 2020-12-26 DIAGNOSIS — Z87891 Personal history of nicotine dependence: Secondary | ICD-10-CM | POA: Diagnosis not present

## 2020-12-26 DIAGNOSIS — O3413 Maternal care for benign tumor of corpus uteri, third trimester: Secondary | ICD-10-CM | POA: Diagnosis present

## 2020-12-26 DIAGNOSIS — O3663X0 Maternal care for excessive fetal growth, third trimester, not applicable or unspecified: Principal | ICD-10-CM | POA: Diagnosis present

## 2020-12-26 DIAGNOSIS — O9902 Anemia complicating childbirth: Secondary | ICD-10-CM | POA: Diagnosis not present

## 2020-12-26 LAB — CBC
HCT: 30.5 % — ABNORMAL LOW (ref 36.0–46.0)
Hemoglobin: 10.2 g/dL — ABNORMAL LOW (ref 12.0–15.0)
MCH: 29.9 pg (ref 26.0–34.0)
MCHC: 33.4 g/dL (ref 30.0–36.0)
MCV: 89.4 fL (ref 80.0–100.0)
Platelets: 268 10*3/uL (ref 150–400)
RBC: 3.41 MIL/uL — ABNORMAL LOW (ref 3.87–5.11)
RDW: 14.3 % (ref 11.5–15.5)
WBC: 9.5 10*3/uL (ref 4.0–10.5)
nRBC: 0 % (ref 0.0–0.2)

## 2020-12-26 LAB — RESP PANEL BY RT-PCR (FLU A&B, COVID) ARPGX2
Influenza A by PCR: NEGATIVE
Influenza B by PCR: NEGATIVE
SARS Coronavirus 2 by RT PCR: NEGATIVE

## 2020-12-26 LAB — TYPE AND SCREEN
ABO/RH(D): A POS
Antibody Screen: NEGATIVE

## 2020-12-26 LAB — RPR: RPR Ser Ql: NONREACTIVE

## 2020-12-26 MED ORDER — FENTANYL CITRATE (PF) 100 MCG/2ML IJ SOLN
50.0000 ug | INTRAMUSCULAR | Status: DC | PRN
  Administered 2020-12-26 – 2020-12-27 (×2): 50 ug via INTRAVENOUS
  Filled 2020-12-26 (×2): qty 2

## 2020-12-26 MED ORDER — ACETAMINOPHEN 325 MG PO TABS: 650.0000 mg | ORAL_TABLET | ORAL | Status: DC | PRN

## 2020-12-26 MED ORDER — MISOPROSTOL 25 MCG QUARTER TABLET
ORAL_TABLET | ORAL | Status: AC
  Administered 2020-12-26: 25 ug
  Filled 2020-12-26: qty 1

## 2020-12-26 MED ORDER — ONDANSETRON HCL 4 MG/2ML IJ SOLN
4.0000 mg | Freq: Four times a day (QID) | INTRAMUSCULAR | Status: DC | PRN
  Administered 2020-12-26 – 2020-12-27 (×3): 4 mg via INTRAVENOUS
  Filled 2020-12-26 (×3): qty 2

## 2020-12-26 MED ORDER — OXYTOCIN BOLUS FROM INFUSION
333.0000 mL | Freq: Once | INTRAVENOUS | Status: AC
  Administered 2020-12-27: 333 mL via INTRAVENOUS

## 2020-12-26 MED ORDER — TERBUTALINE SULFATE 1 MG/ML IJ SOLN: 0.2500 mg | Freq: Once | INTRAMUSCULAR | Status: DC | PRN

## 2020-12-26 MED ORDER — MISOPROSTOL 25 MCG QUARTER TABLET
25.0000 ug | ORAL_TABLET | ORAL | Status: DC | PRN
  Administered 2020-12-26 (×2): 25 ug via VAGINAL
  Filled 2020-12-26 (×2): qty 1

## 2020-12-26 MED ORDER — OXYTOCIN-SODIUM CHLORIDE 30-0.9 UT/500ML-% IV SOLN
2.5000 [IU]/h | INTRAVENOUS | Status: DC
Start: 2020-12-26 — End: 2020-12-27
  Filled 2020-12-26: qty 500

## 2020-12-26 MED ORDER — OXYTOCIN-SODIUM CHLORIDE 30-0.9 UT/500ML-% IV SOLN: 1.0000 m[IU]/min | INTRAVENOUS | Status: DC

## 2020-12-26 MED ORDER — OXYTOCIN-SODIUM CHLORIDE 30-0.9 UT/500ML-% IV SOLN
1.0000 m[IU]/min | INTRAVENOUS | Status: DC
  Administered 2020-12-26: 2 m[IU]/min via INTRAVENOUS

## 2020-12-26 MED ORDER — LACTATED RINGERS IV SOLN
500.0000 mL | INTRAVENOUS | Status: DC | PRN
Start: 2020-12-26 — End: 2020-12-27
  Administered 2020-12-27 (×2): 500 mL via INTRAVENOUS

## 2020-12-26 MED ORDER — SOD CITRATE-CITRIC ACID 500-334 MG/5ML PO SOLN: 30.0000 mL | ORAL | Status: DC | PRN

## 2020-12-26 MED ORDER — LIDOCAINE HCL (PF) 1 % IJ SOLN: 30.0000 mL | INTRAMUSCULAR | Status: DC | PRN

## 2020-12-26 MED ORDER — LACTATED RINGERS IV SOLN: INTRAVENOUS | Status: DC

## 2020-12-26 NOTE — Progress Notes (Signed)
Subjective:    Pt ambulating in halls w/ partner for comfort. Agrees to return to room for cervical exam. No cervical change noted since AROM. Discussed Pitocin augmentation and pt agrees.   Objective:    VS: BP 131/74 (BP Location: Right Arm)   Pulse 68   Temp 98.3 F (36.8 C) (Oral)   Resp 16   Ht 5\' 7"  (1.702 m)   Wt 98 kg   BMI 33.85 kg/m  FHR : baseline 140 / variability moderate / accelerations present / absent decelerations Toco: contractions every 2-4.5 minutes  Membranes: AROM x4 hrs, remains clear Dilation: 6 Effacement (%): 80 Cervical Position: Middle Station: -2, -1 Presentation: Vertex Exam by:: Adonis Huguenin, CNM   Assessment/Plan:   30 y.o. G2P0010 [redacted]w[redacted]d IOL for suspected LGA  Labor:  S/P Cytotec x2, cook balloon, AROM, starting Pitocin now Fetal Wellbeing:  Category I Pain Control:   freedom of movement I/D:   GBS neg Anticipated MOD:  NSVD  Arrie Eastern MSN, CNM 12/26/2020 10:45 PM

## 2020-12-26 NOTE — Progress Notes (Signed)
Cassidy Thompson is a 30 y.o. G2P0010 at [redacted]w[redacted]d by LMP admitted for induction of labor due to Elective at term.  Subjective: Patient without complaints, she denies severe pain  Objective: BP 126/80   Pulse 79   Temp 98.5 F (36.9 C) (Oral)   Resp 18   Ht 5\' 7"  (1.702 m)   Wt 98 kg   BMI 33.85 kg/m  I/O last 3 completed shifts: In: 122.4 [I.V.:122.4] Out: -  No intake/output data recorded.  FHT:  FHR: 150 bpm, variability: moderate,  accelerations:  Present 10x10, decelerations:  Absent UC:   regular, every 1-2 minutes SVE:   Dilation: 6 Effacement (%): 90 Station: -1 Exam by:: Dr. Alwyn Pea AROM done - clear fluid  Labs: Lab Results  Component Value Date   WBC 9.5 12/26/2020   HGB 10.2 (L) 12/26/2020   HCT 30.5 (L) 12/26/2020   MCV 89.4 12/26/2020   PLT 268 12/26/2020    Assessment / Plan: Induction of labor due to elective at term / suspected LGA,  progressing well. Cook balloon fell out AROM done, Will hold off on pitocin augmentation as patient is already contracting regularly  Labor: Progressing normally Preeclampsia:  no signs or symptoms of toxicity Fetal Wellbeing:  Category I Pain Control:  Labor support without medications and IV pain meds I/D:  n/a Anticipated MOD:  NSVD  Abriella Filkins 12/26/2020, 6:32 PM

## 2020-12-26 NOTE — Progress Notes (Addendum)
Subjective:    Using freedom of movement to manage discomforts of labor. Assisted pt w/ side lying release positions and discussed pt's desires for labor management. Deferring VE until pt becomes more uncomfortable.   Objective:    VS: BP 126/80   Pulse 79   Temp 98.5 F (36.9 C) (Oral)   Resp 18   Ht 5\' 7"  (1.702 m)   Wt 98 kg   BMI 33.85 kg/m  FHR : baseline 145 / variability moderate / accelerations present / absent decelerations Toco: contractions every 2-3 minutes  Membranes: AROM x1 hr   Assessment/Plan:   30 y.o. G2P0010 [redacted]w[redacted]d IOL suspected LGA  Labor:  S/P Cytotec x2, cook balloon, AROM , will repeat SVE in 2 hrs to determine need for Pitocin Fetal Wellbeing:  Category I Pain Control:   ambulating I/D:   GBS neg Anticipated MOD:  NSVD  Arrie Eastern MSN, CNM 12/26/2020 8:20 PM

## 2020-12-26 NOTE — H&P (Signed)
OB ADMISSION/ HISTORY & PHYSICAL:  Admission Date: 12/26/2020  3:23 AM  Admit Diagnosis: Induction of labor  Cassidy Thompson is a 30 y.o. female G2P0010 [redacted]w[redacted]d presenting for IOL for LGA. Endorses active FM, denies LOF and vaginal bleeding. Occasional ctx.  History of current pregnancy: G2P0010   Primary OB Provider: [facility] Patient entered care with [facility] at 9.1 wks.   EDC 12/25/20 by LMP 05/25/20 and congruent w/ 9.1 wk U/S.   Anatomy scan:  26.6 wks, complete w/ anterior placenta.   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 from a fall no sequellae Patient Active Problem List   Diagnosis Date Noted   LGA (large for gestational age) infant 12/26/2020   Seasonal allergies 06/25/2018   Migraines 03/22/2018   Frequent headaches 03/22/2018   History of motion sickness 03/22/2018    Prenatal Labs: ABO, Rh: --/--/A POS (11/19 0403) Antibody: PENDING (11/19 0403) Rubella:   Immune RPR:   NR HBsAg:   NR HIV:   NR GTT: 87 GBS:   Negative GC/CHL: Negative/Negative Genetics: Low risk female, Horizon negative Tdap/influenza vaccines: Declined   OB History  Gravida Para Term Preterm AB Living  2       1    SAB IAB Ectopic Multiple Live Births  1       0    # Outcome Date GA Lbr Len/2nd Weight Sex Delivery Anes PTL Lv  2 Current           1 SAB              Birth Comments: System Generated. Please review and update pregnancy details.    Medical / Surgical History: Past medical history:  Past Medical History:  Diagnosis Date   Chronic headaches    Closed TBI (traumatic brain injury) 2011   Seasonal allergies     Past surgical history:  Past Surgical History:  Procedure Laterality Date   WISDOM TOOTH  EXTRACTION     Family History:  Family History  Problem Relation Age of Onset   Kidney Stones Mother    Diabetes Maternal Grandmother    Heart attack Maternal Grandfather     Social History:  reports that she has quit smoking. She has never used smokeless tobacco. She reports that she does not currently use alcohol. She reports that she does not use drugs.  Allergies: Patient has no known allergies.   Current Medications at time of admission:  Prior to Admission medications   Medication Sig Start Date End Date Taking? Authorizing Provider  benzonatate (TESSALON) 100 MG capsule Take 1 capsule (100 mg total) by mouth every 8 (eight) hours. 12/17/18   Sharion Balloon, NP  cetirizine (ZYRTEC) 10 MG tablet TAKE 1 TABLET (10 MG TOTAL) BY MOUTH DAILY. FOR ALLERGIES. 09/18/18   Pleas Koch, NP  cholecalciferol (VITAMIN D3) 25 MCG (1000 UNIT) tablet Take 1,000 Units by mouth daily.    [provider]  montelukast (SINGULAIR) 10 MG tablet TAKE 1 TABLET (10 MG TOTAL) BY MOUTH AT BEDTIME. FOR ALLERGIES. 09/18/18   Pleas Koch, NP  ondansetron (ZOFRAN ODT) 4 MG disintegrating tablet Take 1 tablet (4 mg total) by mouth every 6 (six) hours as needed for  nausea. 06/04/20   Seabron Spates, CNM  promethazine (PHENERGAN) 25 MG tablet Take 25 mg by mouth every 6 (six) hours as needed for nausea or vomiting.    [provider]    Review of Systems: Constitutional: Negative   HENT: Negative   Eyes: Negative   Respiratory: Negative   Cardiovascular: Negative   Gastrointestinal: Negative  Genitourinary: Negative for bloody show, Negative for LOF   Musculoskeletal: Negative   Skin: Negative   Neurological: Negative   Endo/Heme/Allergies: Negative   Psychiatric/Behavioral: Negative    Physical Exam: VS: Blood pressure 128/71, pulse 93, temperature 98.3 F (36.8 C), temperature source Oral, resp. rate 18, height 5\' 7"  (1.702 m), weight 98 kg, unknown if currently  breastfeeding. AAO x3, no signs of distress Cardiovascular: RRR Respiratory: Lung fields clear to ausculation GU/GI: Abdomen gravid, non-tender, non-distended, active FM, vertex, EFW 8lbs per Leopold's Extremities: negative edema, negative for pain, tenderness, and cords  Cervical exam:Dilation: 1 Effacement (%): 70 Station: -2 FHR: baseline rate 140 / variability moderate / accelerations present / absent decelerations TOCO: Irregular   Prenatal Transfer Tool  Maternal Diabetes: No Genetic Screening: Normal Maternal Ultrasounds/Referrals: Normal Fetal Ultrasounds or other Referrals:  None Maternal Substance Abuse:  No Significant Maternal Medications:  None Significant Maternal Lab Results: Group B Strep negative    Assessment: 30 y.o. G2P0010 [redacted]w[redacted]d IOL for LGA. Last EFW @ 39 wks was 9lbs 3 oz, 95th %.  Induction of labor FHR category 1 GBS negative Pain management plan: epidural prn   Plan:  Admit to L&D Routine admission orders Epidural PRN Cytotec 69mcg per vagina placed @ 2778 Will notify Dr Alwyn Pea of admission and plan of care  Domingo Pulse MSN, CNM 12/26/2020 4:50 AM

## 2020-12-26 NOTE — Progress Notes (Signed)
MD Progress note - late entry - patient seen at bedside 0930 am  Patient ID: Cassidy Thompson, female   DOB: 1990/10/12, 30 y.o.   MRN: [id number]   Cassidy Thompson is a 30 y.o. G2P0010 at [redacted]w[redacted]d admitted for induction of labor due to elective for LGA.  Subjective: Patient w/o complaints she is not feeling pain yet.   Objective: BP (!) 104/57   Pulse 82   Temp 98.9 F (37.2 C) (Oral)   Resp 16   Ht 5\' 7"  (1.702 m)   Wt 98 kg   BMI 33.85 kg/m  I/O last 3 completed shifts: In: 122.4 [I.V.:122.4] Out: -  No intake/output data recorded.  FHT:  FHR: 150 bpm, variability: moderate,  accelerations:  Present,  decelerations:  Absent UC:   rare SVE:   Dilation: 1 Effacement (%): 60 Station: -2 Exam by:: Acey Lav RN  Labs: Lab Results  Component Value Date   WBC 9.5 12/26/2020   HGB 10.2 (L) 12/26/2020   HCT 30.5 (L) 12/26/2020   MCV 89.4 12/26/2020   PLT 268 12/26/2020    Assessment / Plan: Induction of labor due to elective at term for LGA,  s/p two doses of cytotec  Labor:  latent Fetal Wellbeing:  Category I Pain Control:  Labor support without medications I/D:  n/a Anticipated MOD:   Mel Almond MSN, CNM 12/26/2020, 12:21 PM

## 2020-12-26 NOTE — Progress Notes (Signed)
MD Progress Note  Cassidy Thompson is a 30 y.o. G2P0010 at [redacted]w[redacted]d by LMP admitted for induction of labor due to Elective at term.  Subjective: Patient starting to feel more contractions  Objective: BP 124/71   Pulse 73   Temp 98.9 F (37.2 C) (Oral)   Resp 16   Ht 5\' 7"  (1.702 m)   Wt 98 kg   BMI 33.85 kg/m  I/O last 3 completed shifts: In: 122.4 [I.V.:122.4] Out: -  No intake/output data recorded.  FHT:  FHR: 125 bpm, variability: moderate,  accelerations:  Present,  decelerations:  Absent UC:   irregular, every 2-5 minutes SVE:   Dilation: 1 Effacement (%): 60 Station: -2 Exam by:: Acey Lav RN  Sterile Speculum examination - Double bubble Cook balloon placed (60cc each balloon)  Labs: Lab Results  Component Value Date   WBC 9.5 12/26/2020   HGB 10.2 (L) 12/26/2020   HCT 30.5 (L) 12/26/2020   MCV 89.4 12/26/2020   PLT 268 12/26/2020    Assessment / Plan: Induction of labor due to elective at term, suspected macrosomia,  s/p 3 doses of misoprostol And now placement of induction balloon  Labor:  latent  Preeclampsia:  no signs or symptoms of toxicity Fetal Wellbeing:  Category I Pain Control:  Labor support without medications I/D:  n/a Anticipated MOD:   unk  Cassidy Thompson 12/26/2020, 3:13 PM

## 2020-12-27 ENCOUNTER — Inpatient Hospital Stay (HOSPITAL_COMMUNITY): Payer: BC Managed Care – PPO | Admitting: Anesthesiology

## 2020-12-27 ENCOUNTER — Encounter (HOSPITAL_COMMUNITY): Payer: Self-pay | Admitting: Obstetrics & Gynecology

## 2020-12-27 MED ORDER — TRANEXAMIC ACID-NACL 1000-0.7 MG/100ML-% IV SOLN
INTRAVENOUS | Status: AC
  Administered 2020-12-27: 1000 mg
  Filled 2020-12-27: qty 100

## 2020-12-27 MED ORDER — LIDOCAINE-EPINEPHRINE (PF) 2 %-1:200000 IJ SOLN
INTRAMUSCULAR | Status: DC | PRN
  Administered 2020-12-27: 5 mL via EPIDURAL

## 2020-12-27 MED ORDER — WITCH HAZEL-GLYCERIN EX PADS: 1.0000 "application " | MEDICATED_PAD | CUTANEOUS | Status: DC | PRN

## 2020-12-27 MED ORDER — LORATADINE 10 MG PO TABS
10.0000 mg | ORAL_TABLET | Freq: Every day | ORAL | Status: DC
  Administered 2020-12-28: 10 mg via ORAL
  Filled 2020-12-27: qty 1

## 2020-12-27 MED ORDER — BENZOCAINE-MENTHOL 20-0.5 % EX AERO
1.0000 "application " | INHALATION_SPRAY | CUTANEOUS | Status: DC | PRN
  Administered 2020-12-27: 1 via TOPICAL
  Filled 2020-12-27: qty 56

## 2020-12-27 MED ORDER — OXYCODONE HCL 5 MG PO TABS: 5.0000 mg | ORAL_TABLET | ORAL | Status: DC | PRN

## 2020-12-27 MED ORDER — DIPHENHYDRAMINE HCL 25 MG PO CAPS: 25.0000 mg | ORAL_CAPSULE | Freq: Four times a day (QID) | ORAL | Status: DC | PRN

## 2020-12-27 MED ORDER — ZOLPIDEM TARTRATE 5 MG PO TABS: 5.0000 mg | ORAL_TABLET | Freq: Every evening | ORAL | Status: DC | PRN

## 2020-12-27 MED ORDER — METHYLERGONOVINE MALEATE 0.2 MG PO TABS: 0.2000 mg | ORAL_TABLET | ORAL | Status: DC | PRN

## 2020-12-27 MED ORDER — TETANUS-DIPHTH-ACELL PERTUSSIS 5-2.5-18.5 LF-MCG/0.5 IM SUSY: 0.5000 mL | PREFILLED_SYRINGE | Freq: Once | INTRAMUSCULAR | Status: DC

## 2020-12-27 MED ORDER — LACTATED RINGERS IV SOLN: INTRAVENOUS | Status: DC

## 2020-12-27 MED ORDER — METHYLERGONOVINE MALEATE 0.2 MG/ML IJ SOLN: 0.2000 mg | INTRAMUSCULAR | Status: DC | PRN

## 2020-12-27 MED ORDER — METHYLERGONOVINE MALEATE 0.2 MG/ML IJ SOLN
INTRAMUSCULAR | Status: AC
  Administered 2020-12-27: 0.2 mg
  Filled 2020-12-27: qty 1

## 2020-12-27 MED ORDER — SIMETHICONE 80 MG PO CHEW: 80.0000 mg | CHEWABLE_TABLET | ORAL | Status: DC | PRN

## 2020-12-27 MED ORDER — COCONUT OIL OIL: 1.0000 "application " | TOPICAL_OIL | Status: DC | PRN

## 2020-12-27 MED ORDER — ONDANSETRON HCL 4 MG/2ML IJ SOLN: 4.0000 mg | INTRAMUSCULAR | Status: DC | PRN

## 2020-12-27 MED ORDER — DIBUCAINE (PERIANAL) 1 % EX OINT: 1.0000 "application " | TOPICAL_OINTMENT | CUTANEOUS | Status: DC | PRN

## 2020-12-27 MED ORDER — OXYTOCIN-SODIUM CHLORIDE 30-0.9 UT/500ML-% IV SOLN: 2.5000 [IU]/h | INTRAVENOUS | Status: DC | PRN

## 2020-12-27 MED ORDER — OXYCODONE HCL 5 MG PO TABS: 10.0000 mg | ORAL_TABLET | ORAL | Status: DC | PRN

## 2020-12-27 MED ORDER — SENNOSIDES-DOCUSATE SODIUM 8.6-50 MG PO TABS
2.0000 | ORAL_TABLET | Freq: Every day | ORAL | Status: DC
  Administered 2020-12-28: 2 via ORAL
  Filled 2020-12-27: qty 2

## 2020-12-27 MED ORDER — FENTANYL-BUPIVACAINE-NACL 0.5-0.125-0.9 MG/250ML-% EP SOLN
EPIDURAL | Status: AC
  Filled 2020-12-27: qty 250

## 2020-12-27 MED ORDER — ACETAMINOPHEN 325 MG PO TABS: 650.0000 mg | ORAL_TABLET | ORAL | Status: DC | PRN

## 2020-12-27 MED ORDER — MONTELUKAST SODIUM 10 MG PO TABS
10.0000 mg | ORAL_TABLET | Freq: Every day | ORAL | Status: DC
  Filled 2020-12-27: qty 1

## 2020-12-27 MED ORDER — ONDANSETRON HCL 4 MG PO TABS: 4.0000 mg | ORAL_TABLET | ORAL | Status: DC | PRN

## 2020-12-27 MED ORDER — PRENATAL MULTIVITAMIN CH
1.0000 | ORAL_TABLET | Freq: Every day | ORAL | Status: DC
  Administered 2020-12-27 – 2020-12-28 (×2): 1 via ORAL
  Filled 2020-12-27 (×2): qty 1

## 2020-12-27 MED ORDER — IBUPROFEN 600 MG PO TABS
600.0000 mg | ORAL_TABLET | Freq: Four times a day (QID) | ORAL | Status: DC
  Administered 2020-12-27 – 2020-12-28 (×4): 600 mg via ORAL
  Filled 2020-12-27 (×4): qty 1

## 2020-12-27 MED ORDER — TRANEXAMIC ACID-NACL 1000-0.7 MG/100ML-% IV SOLN: 1000.0000 mg | INTRAVENOUS | Status: DC

## 2020-12-27 MED ORDER — FENTANYL-BUPIVACAINE-NACL 0.5-0.125-0.9 MG/250ML-% EP SOLN
EPIDURAL | Status: DC | PRN
  Administered 2020-12-27: 12 mL/h via EPIDURAL

## 2020-12-27 NOTE — Progress Notes (Signed)
Requesting epidural. RN performed SVE.   Burman Foster, MSN, CNM 12/27/2020 4:28 AM

## 2020-12-27 NOTE — Anesthesia Postprocedure Evaluation (Signed)
Anesthesia Post Note  Patient: Cassidy Thompson  Procedure(s) Performed: AN AD HOC LABOR EPIDURAL     Patient location during evaluation: Mother Baby Anesthesia Type: Epidural Level of consciousness: awake Pain management: satisfactory to patient Vital Signs Assessment: post-procedure vital signs reviewed and stable Respiratory status: spontaneous breathing Cardiovascular status: stable Anesthetic complications: no   No notable events documented.  Last Vitals:  Vitals:   12/27/20 1015 12/27/20 1146  BP:  (!) 123/55  Pulse:  85  Resp: 18   Temp:  36.8 C  SpO2:  99%    Last Pain:  Vitals:   12/27/20 1045  TempSrc:   PainSc: 0-No pain   Pain Goal: Patients Stated Pain Goal: 5 (12/26/20 0549)                 Casimer Lanius

## 2020-12-27 NOTE — Progress Notes (Signed)
Subjective:    Reports increased rectal pressure.   Objective:    VS: BP 120/68   Pulse (!) 106   Temp 98.5 F (36.9 C) (Oral)   Resp 18   Ht 5\' 7"  (1.702 m)   Wt 98 kg   BMI 33.85 kg/m  FHR : baseline 135 / variability moderate / accelerations present / early, variable decelerations IUPC: contractions every 2-3 minutes / MVU 170 Dilation: Lip/rim Effacement (%): 90 Cervical Position: Middle Station: Plus 1 Presentation: Vertex Exam by:: Adonis Huguenin, CNM Pitocin 4 mU/min  Assessment/Plan:   30 y.o. G2P0010 [redacted]w[redacted]d  Labor: Progressing normally Fetal Wellbeing:  Category II Pain Control:  Epidural  Arrie Eastern MSN, CNM 12/27/2020 6:00 AM

## 2020-12-27 NOTE — Progress Notes (Signed)
Subjective:    Ctx have become more uncomfortable. Discussed IUPC placement and pt agrees.   Objective:    VS: BP 131/74 (BP Location: Right Arm)   Pulse 68   Temp 98.4 F (36.9 C) (Oral)   Resp 18   Ht 5\' 7"  (1.702 m)   Wt 98 kg   BMI 33.85 kg/m  FHR : baseline 155 / variability moderate / accelerations present / early, variable decelerations IUPC: placed, contractions every 2-4 minutes  Membranes: AROM x6 hrs, remains clear Dilation: 6.5 Effacement (%): 80 Cervical Position: Middle Station: -2, -1 Presentation: Vertex Exam by:: Adonis Huguenin, CNM Pitocin 4 mU/min  Assessment/Plan:   30 y.o. G2P0010 [redacted]w[redacted]d IOL for suspected LGA 95% @ 39 wks Anterior intramural fundal fibroid 3.8 x 3.2 x3.5 Marginal cord insertion  Labor:  protracted active phase, on Pitocin, IUPC placed Fetal Wellbeing:  Category I Pain Control:   breathing I/D:   GBS neg Anticipated MOD:  NSVD  Arrie Eastern MSN, CNM 12/27/2020 1:24 AM

## 2020-12-27 NOTE — Lactation Note (Signed)
This note was copied from a baby's chart. Lactation Consultation Note  Patient Name: Girl Sherea Liptak [id number] Date: 12/27/2020 Reason for consult: L&D Initial assessment Age:28 hours  P1, Mother has baby latched upon entering with intermittent swallows.  Lactation to follow up on MBU.   LATCH Score Latch: Grasps breast easily, tongue down, lips flanged, rhythmical sucking. (latched upon entering)  Audible Swallowing: A few with stimulation  Type of Nipple: Everted at rest and after stimulation  Comfort (Breast/Nipple): Soft / non-tender  Hold (Positioning): Assistance needed to correctly position infant at breast and maintain latch.  LATCH Score: 8    Interventions Interventions: Skin to skin;Education Consult Status      Carlye Grippe 12/27/2020, 9:39 AM

## 2020-12-27 NOTE — Progress Notes (Signed)
Cassidy Thompson is a 30 y.o. G2P0010 at [redacted]w[redacted]d by LMP admitted for induction of labor due to Elective at term.  Subjective: Patient comfortable with epidural. Feels more rectal pressure  Objective: BP 124/69   Pulse 88   Temp 98.3 F (36.8 C) (Oral)   Resp 14   Ht 5\' 7"  (1.702 m)   Wt 98 kg   BMI 33.85 kg/m  I/O last 3 completed shifts: In: 122.4 [I.V.:122.4] Out: -  No intake/output data recorded.  FHT:  FHR: 125 bpm, variability: moderate,  accelerations:  Present,  decelerations:  Present early variable decels with contractions UC:   regular, every 1-2 minutes SVE:   Dilation: 10 Effacement (%): 90 Station: Plus 1 Exam by:: Kyan Giannone, MD  Labs: Lab Results  Component Value Date   WBC 9.5 12/26/2020   HGB 10.2 (L) 12/26/2020   HCT 30.5 (L) 12/26/2020   MCV 89.4 12/26/2020   PLT 268 12/26/2020    Assessment / Plan: Induction of labor due to elective at term,  progressed now to completion,  Will initiate pushing  Labor:  stage 2 Preeclampsia:  no signs or symptoms of toxicity Fetal Wellbeing:  Category I Pain Control:  Epidural I/D:  n/a Anticipated MOD:  NSVD  Kanae Ignatowski 12/27/2020, 7:21 AM

## 2020-12-27 NOTE — Anesthesia Procedure Notes (Signed)
Epidural  Start time: 12/27/2020 4:25 AM End time: 12/27/2020 4:35 AM  Needle:  Needle insertion depth: 6 cm Catheter at skin depth: 11 cm

## 2020-12-27 NOTE — Anesthesia Preprocedure Evaluation (Signed)
Anesthesia Evaluation  Patient identified by MRN, date of birth, ID band Patient awake    Reviewed: Allergy & Precautions, NPO status , Patient's Chart, lab work & pertinent test results  Airway Mallampati: II  TM Distance: >3 FB Neck ROM: Full    Dental no notable dental hx.    Pulmonary neg pulmonary ROS, former smoker,    Pulmonary exam normal breath sounds clear to auscultation       Cardiovascular negative cardio ROS Normal cardiovascular exam Rhythm:Regular Rate:Normal     Neuro/Psych  Headaches, negative psych ROS   GI/Hepatic negative GI ROS, Neg liver ROS,   Endo/Other  negative endocrine ROS  Renal/GU negative Renal ROS  negative genitourinary   Musculoskeletal negative musculoskeletal ROS (+)   Abdominal   Peds  Hematology negative hematology ROS (+)   Anesthesia Other Findings IOL for LGA  Reproductive/Obstetrics (+) Pregnancy                             Anesthesia Physical Anesthesia Plan  ASA: 2  Anesthesia Plan: Epidural   Post-op Pain Management:    Induction:   PONV Risk Score and Plan: Treatment may vary due to age or medical condition  Airway Management Planned: Natural Airway  Additional Equipment:   Intra-op Plan:   Post-operative Plan:   Informed Consent: I have reviewed the patients History and Physical, chart, labs and discussed the procedure including the risks, benefits and alternatives for the proposed anesthesia with the patient or authorized representative who has indicated his/her understanding and acceptance.       Plan Discussed with: Anesthesiologist  Anesthesia Plan Comments: (Patient identified. Risks, benefits, options discussed with patient including but not limited to bleeding, infection, nerve damage, paralysis, failed block, incomplete pain control, headache, blood pressure changes, nausea, vomiting, reactions to medication,  itching, and post partum back pain. Confirmed with bedside nurse the patient's most recent platelet count. Confirmed with the patient that they are not taking any anticoagulation, have any bleeding history or any family history of bleeding disorders. Patient expressed understanding and wishes to proceed. All questions were answered. )        Anesthesia Quick Evaluation

## 2020-12-27 NOTE — Progress Notes (Signed)
Subjective:    Comfortable w/ epidural. Position change to facilitate fetal rotation and to optimize perfusion.   Objective:    VS: BP 131/62   Pulse 89   Temp 98.5 F (36.9 C) (Oral)   Resp 18   Ht 5\' 7"  (1.702 m)   Wt 98 kg   BMI 33.85 kg/m  FHR : baseline 150 / variability moderate / accelerations 10x10 / variable and late decelerations IUPC: contractions every 2 minutes / MVU 180 Membranes: AROM x11 hrs Dilation: 7.5 Effacement (%): 80, 90 Cervical Position: Middle Station: -1 Presentation: Vertex Exam by:: Conan Bowens, RN Pitocin 10 reduced to 4 mU/min  Assessment/Plan:   30 y.o. G2P0010 [redacted]w[redacted]d IOL for suspected LGA 95% @ 39 wks Anterior intramural fundal fibroid 3.8 x 3.2 x3.5 Marginal cord insertion  Labor:  active phase Fetal Wellbeing:  Category II Pain Control:  Epidural I/D:   GBS neg   Arrie Eastern MSN, CNM 12/27/2020 5:22 AM

## 2020-12-28 LAB — CBC
HCT: 28.9 % — ABNORMAL LOW (ref 36.0–46.0)
Hemoglobin: 9.3 g/dL — ABNORMAL LOW (ref 12.0–15.0)
MCH: 29.4 pg (ref 26.0–34.0)
MCHC: 32.2 g/dL (ref 30.0–36.0)
MCV: 91.5 fL (ref 80.0–100.0)
Platelets: 250 10*3/uL (ref 150–400)
RBC: 3.16 MIL/uL — ABNORMAL LOW (ref 3.87–5.11)
RDW: 14.5 % (ref 11.5–15.5)
WBC: 13.5 10*3/uL — ABNORMAL HIGH (ref 4.0–10.5)
nRBC: 0 % (ref 0.0–0.2)

## 2020-12-28 MED ORDER — IBUPROFEN 600 MG PO TABS: 600.0000 mg | ORAL_TABLET | Freq: Four times a day (QID) | ORAL | 0 refills | Status: DC

## 2020-12-28 MED ORDER — ACETAMINOPHEN 325 MG PO TABS: 650.0000 mg | ORAL_TABLET | ORAL | Status: DC | PRN

## 2020-12-28 NOTE — Discharge Summary (Signed)
OB Discharge Summary  Patient Name: Cassidy Thompson DOB: [birth year] MRN: [id number]  Date of admission: 12/26/2020 Intrauterine pregnancy: [redacted]w[redacted]d   Admitting diagnosis: LGA (large for gestational age) infant [P08.1] Secondary diagnosis:  Induction of labor  Date of discharge: 12/28/2020    Discharge diagnosis: Term Pregnancy Delivered     Prenatal history: G2P1011   EDC : 12/27/2020, by Other Basis  P[facility] care at [facility]  Prenatal course complicated by  Anemia Marginal cord insertion Fibriods  Prenatal Labs: ABO, Rh: --/--/A POS (11/19 0403) /  Antibody: NEG (11/19 0403) Rubella: Immune (04/18 0000)  / RPR: NON REACTIVE (11/19 0403)  HBsAg: Negative (04/18 0000)  HIV: Non-reactive (04/18 0000)  GBS: Negative/-- (11/01 0000)                                    Hospital course:  Induction of Labor With Vaginal Delivery   30 y.o. yo G2P1011 at [redacted]w[redacted]d was admitted to the hospital 12/26/2020 for induction of labor.  Indication for induction:  LGA fetus .  Patient had an uncomplicated labor course as follows: Membrane Rupture Time/Date: 6:18 PM ,12/26/2020   Delivery Method:Vaginal, Spontaneous  Episiotomy: Median  Lacerations:  2nd degree  Details of delivery can be found in separate delivery note.  Patient had a routine postpartum course. Patient is discharged home 12/28/20.  Newborn Data: Birth date:12/27/2020  Birth time:8:29 AM  Gender:Female  Living status:Living  Apgars:8 ,9  Weight:3799 g  Delivering PROVIDER: Sanjuana Kava                                                            Complications: None  Newborn Data: Live born female  Birth Weight: 8 lb 6 oz (3799 g) APGAR: 8, 9  Newborn Delivery   Birth date/time: 12/27/2020 08:29:00 Delivery type: Vaginal, Spontaneous      Baby Feeding: Breast Disposition:home with mother  Post partum procedures: none  Labs: Lab Results  Component Value Date   WBC 13.5 (H) 12/28/2020   HGB 9.3 (L) 12/28/2020    HCT 28.9 (L) 12/28/2020   MCV 91.5 12/28/2020   PLT 250 12/28/2020   CMP Latest Ref Rng & Units 06/04/2020  Glucose 70 - 99 mg/dL 86  BUN 6 - 20 mg/dL 5(L)  Creatinine 0.44 - 1.00 mg/dL 0.61  Sodium 135 - 145 mmol/L 134(L)  Potassium 3.5 - 5.1 mmol/L 3.9  Chloride 98 - 111 mmol/L 102  CO2 22 - 32 mmol/L 24  Calcium 8.9 - 10.3 mg/dL 9.4  Total Protein 6.5 - 8.1 g/dL 7.5  Total Bilirubin 0.3 - 1.2 mg/dL 0.7  Alkaline Phos 38 - 126 U/L 70  AST 15 - 41 U/L 24  ALT 0 - 44 U/L 35    Physical Exam @ time of discharge:  Vitals:   12/27/20 1544 12/27/20 1851 12/27/20 2209 12/28/20 0556  BP: 127/67 111/66 117/63 108/70  Pulse: 85 74 92 76  Resp:  17 18 18   Temp: 98.4 F (36.9 C) 97.8 F (36.6 C) 98 F (36.7 C) 98.2 F (36.8 C)  TempSrc:   Oral Oral  SpO2: 99% 98% 99% 100%  Weight:      Height:  general: alert, cooperative, and no distress lochia: appropriate uterine fundus: firm perineum: well approximated extremities: mild bilateral edema DVT Evaluation: No evidence of DVT seen on physical exam.  Discharge instructions:  "Baby and Me Booklet"  Discharge Medications:  Allergies as of 12/28/2020   No Known Allergies      Medication List     STOP taking these medications    benzonatate 100 MG capsule Commonly known as: TESSALON   ondansetron 4 MG disintegrating tablet Commonly known as: Zofran ODT   promethazine 25 MG tablet Commonly known as: PHENERGAN       TAKE these medications    acetaminophen 325 MG tablet Commonly known as: TYLENOL Take 2 tablets (650 mg total) by mouth every 4 (four) hours as needed (for pain scale < 4  OR  temperature  >/=  100.5 F).   cetirizine 10 MG tablet Commonly known as: ZYRTEC TAKE 1 TABLET (10 MG TOTAL) BY MOUTH DAILY. FOR ALLERGIES.   cholecalciferol 25 MCG (1000 UNIT) tablet Commonly known as: VITAMIN D3 Take 1,000 Units by mouth daily.   ibuprofen 600 MG tablet Commonly known as: ADVIL Take 1 tablet (600  mg total) by mouth every 6 (six) hours.   montelukast 10 MG tablet Commonly known as: SINGULAIR TAKE 1 TABLET (10 MG TOTAL) BY MOUTH AT BEDTIME. FOR ALLERGIES.       Diet: routine diet Activity: Advance as tolerated. Pelvic rest x 6 weeks.  Follow up:6 weeks  Signed: Kathalene Frames MSN, CNM 12/28/2020, 10:28 AM

## 2020-12-28 NOTE — Lactation Note (Signed)
This note was copied from a baby's chart. Lactation Consultation Note  Patient Name: Cassidy Thompson [id number] Date: 12/28/2020 Reason for consult: Initial assessment;Term;Primapara;1st time breastfeeding Age:28 hours   P1 mother whose infant is now 67 hours old.  This is a term baby at 40+0 weeks.  Mother and baby have discharge orders entered.  Reviewed breast feeding basics with family.  Mother feels like"[name]" has been feeding well.  At this feeding I observed her sleeping at the breast when I arrived.  Discussed the importance of having "nutritive" feedings instead of "non-nutritive" feedings.  Discussed how to know when baby is done feeding.  Encouraged STS and feeding on cue or at least 8-12 times/24 hours.   Parents verbalized understanding.  Engorgement prevention/treatment reviewed.  Manual pump with instructions for use given.  #24 flange size is appropriate at this time.  Father present.  Mother has our OP phone number for any concerns after discharge.    Maternal Data Has patient been taught Hand Expression?: Yes Does the patient have breastfeeding experience prior to this delivery?: No  Feeding Mother's Current Feeding Choice: Breast Milk  LATCH Score                    Lactation Tools Discussed/Used    Interventions Interventions: Education  Discharge Discharge Education: Engorgement and breast care Pump: Personal  Consult Status Consult Status: Complete    [name] R Fred Franzen 12/28/2020, 2:47 PM

## 2021-01-09 ENCOUNTER — Telehealth (HOSPITAL_COMMUNITY): Payer: Self-pay

## 2021-01-09 NOTE — Telephone Encounter (Signed)
"  I'm good." Patient has no questions or concerns about her healing.  "She's good. Eating and gaining weight. She sleeps in a bassinet next to my bed." RN reviewed ABC's of safe sleep with patient. Patient declines any questions or concerns about baby.  EPDS score is 0.    Jerald Kief 01/09/2021,

## 2021-07-26 ENCOUNTER — Ambulatory Visit (HOSPITAL_COMMUNITY)
Admission: EM | Admit: 2021-07-26 | Discharge: 2021-07-26 | Disposition: A | Discharge status: Home / Self Care | Payer: BC Managed Care – PPO | Attending: Emergency Medicine | Admitting: Emergency Medicine

## 2021-07-26 ENCOUNTER — Encounter (HOSPITAL_COMMUNITY): Payer: Self-pay | Admitting: Emergency Medicine

## 2021-07-26 DIAGNOSIS — H6501 Acute serous otitis media, right ear: Secondary | ICD-10-CM

## 2021-07-26 DIAGNOSIS — J069 Acute upper respiratory infection, unspecified: Secondary | ICD-10-CM | POA: Diagnosis not present

## 2021-07-26 MED ORDER — AMOXICILLIN 500 MG PO CAPS: 500.0000 mg | ORAL_CAPSULE | Freq: Two times a day (BID) | ORAL | 0 refills | Status: AC

## 2021-07-26 NOTE — ED Triage Notes (Signed)
Pt is present today with sore throat, right ear pain, and nasal congestion. Pt sx stared yesterday

## 2021-07-26 NOTE — Discharge Instructions (Addendum)
Today you are being treated for an infection of the eardrum  Take amoxicillin twice daily for 10 days, you should begin to see improvement after 48 hours of medication use and then it should progressively get better  You may use Tylenol or ibuprofen for management of discomfort  May hold warm compresses to the ear for additional comfort  Please not attempted any ear cleaning or object or fluid placement into the ear canal to prevent further irritation   For cough: honey 1/2 to 1 teaspoon (you can dilute the honey in water or another fluid).  You can also use guaifenesin and dextromethorphan for cough. You can use a humidifier for chest congestion and cough.  If you don't have a humidifier, you can sit in the bathroom with the hot shower running.      For sore throat: try warm salt water gargles, cepacol lozenges, throat spray, warm tea or water with lemon/honey, popsicles or ice, or OTC cold relief medicine for throat discomfort.   For congestion: take a daily anti-histamine like Zyrtec, Claritin, and a oral decongestant, such as pseudoephedrine.  You can also use Flonase 1-2 sprays in each nostril daily.   It is important to stay hydrated: drink plenty of fluids (water, gatorade/powerade/pedialyte, juices, or teas) to keep your throat moisturized and help further relieve irritation/discomfort.

## 2021-07-26 NOTE — ED Provider Notes (Signed)
[place]    CSN: [id number] Arrival date & time: 07/26/21  0825      History   Chief Complaint Chief Complaint  Patient presents with   Sore Throat   Nasal Congestion   Otalgia    HPI Cassidy Thompson is a 31 y.o. female.   Patient presents with sore throat, nasal congestion, and frequent nonproductive cough, and right-sided ear pain beginning 1 day ago.  Known sick contact in household.  Tolerating food and liquids.  Has not attempted treatment.  Denies fever, chills, body aches, shortness of breath, wheezing.  Past Medical History:  Diagnosis Date   Chronic headaches    Closed TBI (traumatic brain injury) ([place]) 2011   Seasonal allergies     Patient Active Problem List   Diagnosis Date Noted   SVD (spontaneous vaginal delivery) 12/28/2020   Migraines 03/22/2018   Frequent headaches 03/22/2018    Past Surgical History:  Procedure Laterality Date   WISDOM TOOTH EXTRACTION      OB History     Gravida  2   Para  1   Term  1   Preterm      AB  1   Living  1      SAB  1   IAB      Ectopic      Multiple  0   Live Births  1            Home Medications    Prior to Admission medications   Medication Sig Start Date End Date Taking? Authorizing Provider  amoxicillin (AMOXIL) 500 MG capsule Take 1 capsule (500 mg total) by mouth 2 (two) times daily for 10 days. 07/26/21 08/05/21 Yes Derek Huneycutt, Leitha Schuller, NP  acetaminophen (TYLENOL) 325 MG tablet Take 2 tablets (650 mg total) by mouth every 4 (four) hours as needed (for pain scale < 4  OR  temperature  >/=  100.5 F). 12/28/20   Slaughterbeck, Raquel Sarna, CNM  cetirizine (ZYRTEC) 10 MG tablet TAKE 1 TABLET (10 MG TOTAL) BY MOUTH DAILY. FOR ALLERGIES. 09/18/18   Pleas Koch, NP  cholecalciferol (VITAMIN D3) 25 MCG (1000 UNIT) tablet Take 1,000 Units by mouth daily.    [provider]  ibuprofen (ADVIL) 600 MG tablet Take 1 tablet (600 mg total) by mouth every 6 (six) hours. 12/28/20    Slaughterbeck, Raquel Sarna, CNM  montelukast (SINGULAIR) 10 MG tablet TAKE 1 TABLET (10 MG TOTAL) BY MOUTH AT BEDTIME. FOR ALLERGIES. 09/18/18   Pleas Koch, NP    Family History Family History  Problem Relation Age of Onset   Kidney Stones Mother    Diabetes Maternal Grandmother    Heart attack Maternal Grandfather     Social History Social History   Tobacco Use   Smoking status: Former   Smokeless tobacco: Never  Substance Use Topics   Alcohol use: Not Currently    Comment: occasional   Drug use: No     Allergies   Patient has no known allergies.   Review of Systems Review of Systems Defer to HPI   Physical Exam Triage Vital Signs ED Triage Vitals  Enc Vitals Group     BP 07/26/21 0913 119/83     Pulse Rate 07/26/21 0913 (!) 115     Resp 07/26/21 0913 17     Temp 07/26/21 0913 98.5 F (36.9 C)     Temp src --      SpO2 07/26/21 0913 96 %  Weight --      Height --      Head Circumference --      Peak Flow --      Pain Score 07/26/21 0912 8     Pain Loc --      Pain Edu? --      Excl. in [place]? --    No data found.  Updated Vital Signs BP 119/83   Pulse (!) 115   Temp 98.5 F (36.9 C)   Resp 17   LMP 06/14/2021   SpO2 96%   Breastfeeding No   Visual Acuity Right Eye Distance:   Left Eye Distance:   Bilateral Distance:    Right Eye Near:   Left Eye Near:    Bilateral Near:     Physical Exam Constitutional:      Appearance: Normal appearance. She is well-developed.  HENT:     Head: Normocephalic.     Right Ear: Hearing, ear canal and external ear normal. A middle ear effusion is present. Tympanic membrane is erythematous.     Left Ear: Hearing, tympanic membrane, ear canal and external ear normal.     Nose: Congestion and rhinorrhea present.     Mouth/Throat:     Mouth: Mucous membranes are moist.     Pharynx: No posterior oropharyngeal erythema.     Tonsils: No tonsillar exudate. 0 on the right. 0 on the left.  Eyes:      Extraocular Movements: Extraocular movements intact.  Cardiovascular:     Rate and Rhythm: Normal rate and regular rhythm.     Pulses: Normal pulses.     Heart sounds: Normal heart sounds.  Pulmonary:     Effort: Pulmonary effort is normal.     Breath sounds: Normal breath sounds.  Skin:    General: Skin is warm and dry.  Neurological:     Mental Status: She is alert and oriented to person, place, and time. Mental status is at baseline.  Psychiatric:        Mood and Affect: Mood normal.        Behavior: Behavior normal.      UC Treatments / Results  Labs (all labs ordered are listed, but only abnormal results are displayed) Labs Reviewed - No data to display  EKG   Radiology No results found.  Procedures Procedures (including critical care time)  Medications Ordered in UC Medications - No data to display  Initial Impression / Assessment and Plan / UC Course  I have reviewed the triage vital signs and the nursing notes.  Pertinent labs & imaging results that were available during my care of the patient were reviewed by me and considered in my medical decision making (see chart for details).  Nonrecurrent acute serous otitis media of right ear Viral URI  Vital signs are stable, patient is in no signs of distress, O2 saturation 96% on room air and lungs are clear to auscultation, low suspicion for pneumonia, pneumothorax or bronchitis, erythema and bubbling noted to the right tympanic membrane, amoxicillin 10-day course prescribed for treatment, may use Tylenol ibuprofen and warm compresses for comfort, may use additional over-the-counter medications as needed for management of your symptoms, may follow-up with his urgent care as needed Final Clinical Impressions(s) / UC Diagnoses   Final diagnoses:  Viral URI  Non-recurrent acute serous otitis media of right ear     Discharge Instructions      Today you are being treated for an infection of the eardrum  Take  amoxicillin twice daily for 10 days, you should begin to see improvement after 48 hours of medication use and then it should progressively get better  You may use Tylenol or ibuprofen for management of discomfort  May hold warm compresses to the ear for additional comfort  Please not attempted any ear cleaning or object or fluid placement into the ear canal to prevent further irritation   For cough: honey 1/2 to 1 teaspoon (you can dilute the honey in water or another fluid).  You can also use guaifenesin and dextromethorphan for cough. You can use a humidifier for chest congestion and cough.  If you don't have a humidifier, you can sit in the bathroom with the hot shower running.      For sore throat: try warm salt water gargles, cepacol lozenges, throat spray, warm tea or water with lemon/honey, popsicles or ice, or OTC cold relief medicine for throat discomfort.   For congestion: take a daily anti-histamine like Zyrtec, Claritin, and a oral decongestant, such as pseudoephedrine.  You can also use Flonase 1-2 sprays in each nostril daily.   It is important to stay hydrated: drink plenty of fluids (water, gatorade/powerade/pedialyte, juices, or teas) to keep your throat moisturized and help further relieve irritation/discomfort.     ED Prescriptions     Medication Sig Dispense Auth. Provider   amoxicillin (AMOXIL) 500 MG capsule Take 1 capsule (500 mg total) by mouth 2 (two) times daily for 10 days. 20 capsule Hans Eden, NP      PDMP not reviewed this encounter.   Hans Eden, [place] 07/26/21 [phone]

## 2022-01-18 DIAGNOSIS — H538 Other visual disturbances: Secondary | ICD-10-CM | POA: Diagnosis not present

## 2022-01-18 DIAGNOSIS — H3581 Retinal edema: Secondary | ICD-10-CM | POA: Diagnosis not present

## 2022-01-25 ENCOUNTER — Encounter (INDEPENDENT_AMBULATORY_CARE_PROVIDER_SITE_OTHER): Payer: BC Managed Care – PPO | Admitting: Ophthalmology

## 2022-01-25 ENCOUNTER — Telehealth: Payer: Self-pay | Admitting: Primary Care

## 2022-01-25 DIAGNOSIS — H43813 Vitreous degeneration, bilateral: Secondary | ICD-10-CM | POA: Diagnosis not present

## 2022-01-25 DIAGNOSIS — H59031 Cystoid macular edema following cataract surgery, right eye: Secondary | ICD-10-CM | POA: Diagnosis not present

## 2022-01-28 NOTE — Telephone Encounter (Signed)
Error

## 2022-02-15 ENCOUNTER — Encounter (HOSPITAL_COMMUNITY): Payer: Self-pay

## 2022-02-15 ENCOUNTER — Emergency Department (HOSPITAL_BASED_OUTPATIENT_CLINIC_OR_DEPARTMENT_OTHER)
Admission: EM | Admit: 2022-02-15 | Discharge: 2022-02-16 | Disposition: A | Discharge status: Home / Self Care | Payer: BC Managed Care – PPO | Attending: Emergency Medicine | Admitting: Emergency Medicine

## 2022-02-15 ENCOUNTER — Ambulatory Visit (HOSPITAL_COMMUNITY)
Admission: EM | Admit: 2022-02-15 | Discharge: 2022-02-15 | Disposition: A | Discharge status: Other Acute Inpatient Hospital | Payer: BC Managed Care – PPO | Attending: Family Medicine | Admitting: Family Medicine

## 2022-02-15 ENCOUNTER — Emergency Department (HOSPITAL_BASED_OUTPATIENT_CLINIC_OR_DEPARTMENT_OTHER): Payer: BC Managed Care – PPO

## 2022-02-15 ENCOUNTER — Ambulatory Visit (INDEPENDENT_AMBULATORY_CARE_PROVIDER_SITE_OTHER): Payer: BC Managed Care – PPO

## 2022-02-15 ENCOUNTER — Other Ambulatory Visit: Payer: Self-pay

## 2022-02-15 ENCOUNTER — Encounter (HOSPITAL_BASED_OUTPATIENT_CLINIC_OR_DEPARTMENT_OTHER): Payer: Self-pay | Admitting: Emergency Medicine

## 2022-02-15 DIAGNOSIS — R079 Chest pain, unspecified: Secondary | ICD-10-CM

## 2022-02-15 DIAGNOSIS — W19XXXA Unspecified fall, initial encounter: Secondary | ICD-10-CM | POA: Diagnosis not present

## 2022-02-15 DIAGNOSIS — T1490XA Injury, unspecified, initial encounter: Secondary | ICD-10-CM | POA: Diagnosis not present

## 2022-02-15 DIAGNOSIS — S22069A Unspecified fracture of T7-T8 vertebra, initial encounter for closed fracture: Secondary | ICD-10-CM | POA: Diagnosis not present

## 2022-02-15 DIAGNOSIS — N3000 Acute cystitis without hematuria: Secondary | ICD-10-CM | POA: Diagnosis not present

## 2022-02-15 DIAGNOSIS — M549 Dorsalgia, unspecified: Secondary | ICD-10-CM | POA: Diagnosis not present

## 2022-02-15 DIAGNOSIS — W109XXA Fall (on) (from) unspecified stairs and steps, initial encounter: Secondary | ICD-10-CM | POA: Diagnosis not present

## 2022-02-15 DIAGNOSIS — M5126 Other intervertebral disc displacement, lumbar region: Secondary | ICD-10-CM | POA: Diagnosis not present

## 2022-02-15 DIAGNOSIS — Z043 Encounter for examination and observation following other accident: Secondary | ICD-10-CM | POA: Diagnosis not present

## 2022-02-15 LAB — CBC WITH DIFFERENTIAL/PLATELET
Abs Immature Granulocytes: 0.05 10*3/uL (ref 0.00–0.07)
Basophils Absolute: 0 10*3/uL (ref 0.0–0.1)
Basophils Relative: 0 %
Eosinophils Absolute: 0.1 10*3/uL (ref 0.0–0.5)
Eosinophils Relative: 1 %
HCT: 39.7 % (ref 36.0–46.0)
Hemoglobin: 13.1 g/dL (ref 12.0–15.0)
Immature Granulocytes: 1 %
Lymphocytes Relative: 22 %
Lymphs Abs: 2.4 10*3/uL (ref 0.7–4.0)
MCH: 29.5 pg (ref 26.0–34.0)
MCHC: 33 g/dL (ref 30.0–36.0)
MCV: 89.4 fL (ref 80.0–100.0)
Monocytes Absolute: 0.5 10*3/uL (ref 0.1–1.0)
Monocytes Relative: 5 %
Neutro Abs: 7.7 10*3/uL (ref 1.7–7.7)
Neutrophils Relative %: 71 %
Platelets: 336 10*3/uL (ref 150–400)
RBC: 4.44 MIL/uL (ref 3.87–5.11)
RDW: 13.6 % (ref 11.5–15.5)
WBC: 10.9 10*3/uL — ABNORMAL HIGH (ref 4.0–10.5)
nRBC: 0 % (ref 0.0–0.2)

## 2022-02-15 LAB — URINALYSIS, ROUTINE W REFLEX MICROSCOPIC
Bilirubin Urine: NEGATIVE
Glucose, UA: NEGATIVE mg/dL
Hgb urine dipstick: NEGATIVE
Ketones, ur: NEGATIVE mg/dL
Nitrite: NEGATIVE
Protein, ur: NEGATIVE mg/dL
Specific Gravity, Urine: 1.025 (ref 1.005–1.030)
pH: 6 (ref 5.0–8.0)

## 2022-02-15 LAB — COMPREHENSIVE METABOLIC PANEL
ALT: 16 U/L (ref 0–44)
AST: 15 U/L (ref 15–41)
Albumin: 4.7 g/dL (ref 3.5–5.0)
Alkaline Phosphatase: 84 U/L (ref 38–126)
Anion gap: 11 (ref 5–15)
BUN: 13 mg/dL (ref 6–20)
CO2: 24 mmol/L (ref 22–32)
Calcium: 9.9 mg/dL (ref 8.9–10.3)
Chloride: 101 mmol/L (ref 98–111)
Creatinine, Ser: 0.85 mg/dL (ref 0.44–1.00)
GFR, Estimated: >60 mL/min (ref >60)
Glucose, Bld: 94 mg/dL (ref 70–99)
Potassium: 4.2 mmol/L (ref 3.5–5.1)
Sodium: 136 mmol/L (ref 135–145)
Total Bilirubin: 0.5 mg/dL (ref 0.3–1.2)
Total Protein: 8.3 g/dL — ABNORMAL HIGH (ref 6.5–8.1)

## 2022-02-15 LAB — HCG, QUANTITATIVE, PREGNANCY: hCG, Beta Chain, Quant, S: <1 m[IU]/mL (ref <5)

## 2022-02-15 MED ORDER — MORPHINE SULFATE (PF) 4 MG/ML IV SOLN
4.0000 mg | Freq: Once | INTRAVENOUS | Status: AC
  Administered 2022-02-15: 4 mg via INTRAVENOUS
  Filled 2022-02-15: qty 1

## 2022-02-15 MED ORDER — HYDROCODONE-ACETAMINOPHEN 5-325 MG PO TABS
1.0000 | ORAL_TABLET | Freq: Once | ORAL | Status: AC
  Administered 2022-02-15: 1 via ORAL

## 2022-02-15 MED ORDER — DIAZEPAM 5 MG/ML IJ SOLN
2.5000 mg | Freq: Once | INTRAMUSCULAR | Status: AC
  Administered 2022-02-15: 2.5 mg via INTRAVENOUS
  Filled 2022-02-15: qty 2

## 2022-02-15 MED ORDER — IOHEXOL 300 MG/ML  SOLN
100.0000 mL | Freq: Once | INTRAMUSCULAR | Status: AC | PRN
  Administered 2022-02-15: 80 mL via INTRAVENOUS

## 2022-02-15 MED ORDER — HYDROCODONE-ACETAMINOPHEN 5-325 MG PO TABS
ORAL_TABLET | ORAL | Status: AC
  Filled 2022-02-15: qty 1

## 2022-02-15 NOTE — ED Triage Notes (Signed)
Fall down steps, hit bottom and slid down a few stairs.  Was evaluated at [facility] given vicodin and chest x ray.  Still c/o pain so came for further eval  Appears uncomfortable and guarded with movement

## 2022-02-15 NOTE — ED Provider Notes (Signed)
[facility] EMERGENC[facility] DEPT Provider Note   CSN: [id number] Arrival date & time: 02/15/22  1752     History Chief Complaint  Patient presents with   Cassidy Thompson is a 32 y.o. female with history of TBI presents the emergency room today for evaluation after a slip and fall down the stairs.  Patient reports that her stairs were wet and she slipped landing on her bottom and screen down on her bottom down 3-4 steps.  She reports that she felt like she had pain "inside of her body".  She was unable to be specific on where exactly her pain was.  She reports that she has had mid back pain since her epidural in 2022.  She denies any urinary or fecal incontinence.  Denies any saddle anesthesia, numbness or tingling down her legs, fevers, or history of IV drug use.  She reports that she is feeling pain that is radiating into her chest although she does not know where it begins.  She is able to walk still and has more pain with laying down.She denies head or neck pain or any head or neck injury.  Denies any blurry vision or photophobia.  Denies any shortness of breath.  Patient presented here from urgent care as she felt like something else was wrong that was not showing on x-rays and wanted CT imaging so she was sent over here.  Fall Associated symptoms include abdominal pain. Pertinent negatives include no chest pain, no headaches and no shortness of breath.       Home Medications Prior to Admission medications   Medication Sig Start Date End Date Taking? Authorizing Provider  acetaminophen (TYLENOL) 325 MG tablet Take 2 tablets (650 mg total) by mouth every 4 (four) hours as needed (for pain scale < 4  OR  temperature  >/=  100.5 F). 12/28/20   Slaughterbeck, Raquel Sarna, CNM  cetirizine (ZYRTEC) 10 MG tablet TAKE 1 TABLET (10 MG TOTAL) BY MOUTH DAILY. FOR ALLERGIES. 09/18/18   Pleas Koch, NP  cholecalciferol (VITAMIN D3) 25 MCG (1000 UNIT) tablet Take 1,000 Units by mouth  daily.    [provider]  ibuprofen (ADVIL) 600 MG tablet Take 1 tablet (600 mg total) by mouth every 6 (six) hours. 12/28/20   Slaughterbeck, Raquel Sarna, CNM  montelukast (SINGULAIR) 10 MG tablet TAKE 1 TABLET (10 MG TOTAL) BY MOUTH AT BEDTIME. FOR ALLERGIES. 09/18/18   Pleas Koch, NP      Allergies    Patient has no known allergies.    Review of Systems   Review of Systems  Constitutional:  Negative for chills and fever.  Respiratory:  Positive for chest tightness. Negative for shortness of breath.   Cardiovascular:  Negative for chest pain.  Gastrointestinal:  Positive for abdominal pain. Negative for abdominal distention, constipation, diarrhea, nausea and vomiting.       Denies any fecal incontinence  Genitourinary:  Negative for dysuria and hematuria.       Denies any urinary incontinence or urinary retention.  Musculoskeletal:  Positive for back pain. Negative for neck pain.       Denies any saddle anesthesia  Neurological:  Negative for weakness, numbness and headaches.    Physical Exam Updated Vital Signs BP 130/88 (BP Location: Right Arm)   Pulse (!) 108   Temp 98 F (36.7 C)   Resp 15   LMP 02/10/2022   SpO2 100%  Physical Exam Vitals and nursing note reviewed.  Constitutional:  General: She is not in acute distress.    Appearance: Normal appearance. She is not ill-appearing or toxic-appearing.  HENT:     Head: Normocephalic and atraumatic.     Mouth/Throat:     Mouth: Mucous membranes are moist.  Eyes:     General: No scleral icterus.    Extraocular Movements: Extraocular movements intact.     Pupils: Pupils are equal, round, and reactive to light.  Cardiovascular:     Rate and Rhythm: Normal rate and regular rhythm.  Pulmonary:     Effort: Pulmonary effort is normal. No respiratory distress.     Breath sounds: Normal breath sounds.     Comments: No signs of trauma noted to the anterior or lateral chest.  No step-offs or deformities.  No  crepitus. Chest:     Chest wall: No tenderness.  Abdominal:     General: Bowel sounds are normal.     Palpations: Abdomen is soft.     Tenderness: There is no abdominal tenderness. There is no guarding or rebound.     Comments: Soft and nontender.  No signs of trauma noted.  No overlying skin is noted.  Musculoskeletal:        General: No tenderness or deformity.     Cervical back: Normal range of motion. No rigidity or tenderness.     Comments: Patient has some midline thoracic tenderness with some surrounding paraspinal tenderness.  She reports this is chronic for her since her epidural in 2022.  No cervical or lumbar midline or paraspinal tenderness however.  I do not appreciate any step-offs or deformities.  There is no overlying skin changes noted or rashes.  No signs of trauma noted.  Skin:    General: Skin is warm and dry.     Comments: No bruising seen on her buttocks or any tenderness over her SI joints.  Neurological:     General: No focal deficit present.     Mental Status: She is alert. Mental status is at baseline.     Cranial Nerves: No cranial nerve deficit.     Sensory: No sensory deficit.     Motor: No weakness.     Gait: Gait normal.     ED Results / Procedures / Treatments   Labs (all labs ordered are listed, but only abnormal results are displayed) Labs Reviewed  URINE CULTURE - Abnormal; Notable for the following components:      Result Value   Culture   (*)    Value: <10,000 COLONIES/mL INSIGNIFICANT GROWTH Performed at [facility], [address] [place], [place] [address]    All other components within normal limits  CBC WITH DIFFERENTIAL/PLATELET - Abnormal; Notable for the following components:   WBC 10.9 (*)    All other components within normal limits  COMPREHENSIVE METABOLIC PANEL - Abnormal; Notable for the following components:   Total Protein 8.3 (*)    All other components within normal limits  URINALYSIS, ROUTINE W REFLEX MICROSCOPIC  - Abnormal; Notable for the following components:   Leukocytes,Ua MODERATE (*)    Bacteria, UA MANY (*)    All other components within normal limits  HCG, QUANTITATIVE, PREGNANCY    EKG None  Radiology CT L-SPINE NO CHARGE  Result Date: 02/15/2022 CLINICAL DATA:  Fall down stairs, blunt chest and abdominal trauma EXAM: CT Thoracic and Lumbar spine with contrast TECHNIQUE: Multiplanar CT images of the thoracic and lumbar spine were reconstructed from contemporary CT of the Chest, Abdomen,  and Pelvis. RADIATION DOSE REDUCTION: This exam was performed according to the departmental dose-optimization program which includes automated exposure control, adjustment of the mA and/or kV according to patient size and/or use of iterative reconstruction technique. CONTRAST:  No additional contrast was administered for creation of these reformat COMPARISON:  None Available. FINDINGS: CT THORACIC SPINE FINDINGS Alignment: Normal.  No listhesis. Vertebrae: There is an acute superior endplate fracture of T8 with minimal loss of height. The posterior vertebral body wall is intact and there is no retropulsion or associated listhesis. The posterior elements are intact at this level. Remaining vertebral body height is preserved. Paraspinal and other soft tissues: Trace paravertebral soft tissue swelling at T8 and T9 anteriorly. No loculated paraspinal fluid collections. No canal hematoma. Disc levels: Intervertebral disc heights are preserved. No significant canal stenosis. No significant neuroforaminal narrowing. CT LUMBAR SPINE FINDINGS Segmentation: 5 lumbar type vertebrae. Alignment: Normal. Vertebrae: No acute fracture or focal pathologic process. Paraspinal and other soft tissues: Negative. Disc levels: Minimal broad-based disc bulge at L4-5 and mild broad-based disc bulge at L5-S1 without associated significant canal stenosis. No significant neuroforaminal narrowing. No significant facet arthrosis. IMPRESSION: 1. Acute  superior endplate fracture of T8 with minimal loss of height. No retropulsion or associated listhesis. 2. No acute fracture or listhesis of the lumbar spine. 3. Minimal broad-based disc bulges at L4-5 and L5-S1 without associated significant canal stenosis. Electronically Signed   By: Fidela Salisbury M.D.   On: 02/15/2022 22:30   CT T-SPINE NO CHARGE  Result Date: 02/15/2022 CLINICAL DATA:  Fall down stairs, blunt chest and abdominal trauma EXAM: CT Thoracic and Lumbar spine with contrast TECHNIQUE: Multiplanar CT images of the thoracic and lumbar spine were reconstructed from contemporary CT of the Chest, Abdomen, and Pelvis. RADIATION DOSE REDUCTION: This exam was performed according to the departmental dose-optimization program which includes automated exposure control, adjustment of the mA and/or kV according to patient size and/or use of iterative reconstruction technique. CONTRAST:  No additional contrast was administered for creation of these reformat COMPARISON:  None Available. FINDINGS: CT THORACIC SPINE FINDINGS Alignment: Normal.  No listhesis. Vertebrae: There is an acute superior endplate fracture of T8 with minimal loss of height. The posterior vertebral body wall is intact and there is no retropulsion or associated listhesis. The posterior elements are intact at this level. Remaining vertebral body height is preserved. Paraspinal and other soft tissues: Trace paravertebral soft tissue swelling at T8 and T9 anteriorly. No loculated paraspinal fluid collections. No canal hematoma. Disc levels: Intervertebral disc heights are preserved. No significant canal stenosis. No significant neuroforaminal narrowing. CT LUMBAR SPINE FINDINGS Segmentation: 5 lumbar type vertebrae. Alignment: Normal. Vertebrae: No acute fracture or focal pathologic process. Paraspinal and other soft tissues: Negative. Disc levels: Minimal broad-based disc bulge at L4-5 and mild broad-based disc bulge at L5-S1 without associated  significant canal stenosis. No significant neuroforaminal narrowing. No significant facet arthrosis. IMPRESSION: 1. Acute superior endplate fracture of T8 with minimal loss of height. No retropulsion or associated listhesis. 2. No acute fracture or listhesis of the lumbar spine. 3. Minimal broad-based disc bulges at L4-5 and L5-S1 without associated significant canal stenosis. Electronically Signed   By: Fidela Salisbury M.D.   On: 02/15/2022 22:30   CT CHEST ABDOMEN PELVIS W CONTRAST  Result Date: 02/15/2022 CLINICAL DATA:  Blunt polytrauma, fall downstairs EXAM: CT CHEST, ABDOMEN, AND PELVIS WITH CONTRAST TECHNIQUE: Multidetector CT imaging of the chest, abdomen and pelvis was performed following the standard protocol during  bolus administration of intravenous contrast. RADIATION DOSE REDUCTION: This exam was performed according to the departmental dose-optimization program which includes automated exposure control, adjustment of the mA and/or kV according to patient size and/or use of iterative reconstruction technique. CONTRAST:  61m OMNIPAQUE IOHEXOL 300 MG/ML  SOLN COMPARISON:  None Available. FINDINGS: CT CHEST FINDINGS Cardiovascular: No significant vascular findings. Normal heart size. No pericardial effusion. Mediastinum/Nodes: No enlarged mediastinal, hilar, or axillary lymph nodes. Thyroid gland, trachea, and esophagus demonstrate no significant findings. Lungs/Pleura: Lungs are clear. No pleural effusion or pneumothorax. Musculoskeletal: No acute bone abnormality. No suspicious lytic or blastic bone lesion. Circumscribed sclerotic lesion within the superior body of the sternum likely represents a small bone island. CT ABDOMEN PELVIS FINDINGS Hepatobiliary: No focal liver abnormality is seen. No gallstones, gallbladder wall thickening, or biliary dilatation. Pancreas: Unremarkable Spleen: Unremarkable Adrenals/Urinary Tract: The adrenal glands are unremarkable. The kidneys are normal in size and  position. 2 mm nonobstructing calculus noted within the interpolar region of the right kidney. No ureteral calculi. No hydronephrosis. Stomach/Bowel: Stomach is within normal limits. Appendix appears normal. No evidence of bowel wall thickening, distention, or inflammatory changes. Vascular/Lymphatic: The abdominal vasculature is unremarkable save for a retroaortic left renal vein. No pathologic adenopathy within the abdomen and pelvis. Reproductive: Partially calcified intramural/submucosal mass within the uterine fundus likely represents a uterine fibroid. The pelvic organs are otherwise unremarkable. Other: No abdominal wall hernia or abnormality. No abdominopelvic ascites. Musculoskeletal: No acute bone abnormality. No lytic or blastic bone lesion. IMPRESSION: 1. No acute intrathoracic or intra-abdominal injury. 2. Minimal right nonobstructing nephrolithiasis. Electronically Signed   By: AFidela SalisburyM.D.   On: 02/15/2022 22:21   DG Chest 2 View  Result Date: 02/15/2022 CLINICAL DATA:  Fall EXAM: CHEST - 2 VIEW COMPARISON:  None Available. FINDINGS: The heart size and mediastinal contours are within normal limits. No focal pulmonary opacity. No pleural effusion or pneumothorax. The visualized upper abdomen is unremarkable. No acute osseous abnormality. IMPRESSION: No acute cardiopulmonary abnormality. Electronically Signed   By: MBeryle FlockM.D.   On: 02/15/2022 16:51     Procedures Procedures   Medications Ordered in ED Medications  iohexol (OMNIPAQUE) 300 MG/ML solution 100 mL (80 mLs Intravenous Contrast Given 02/15/22 2151)  morphine (PF) 4 MG/ML injection 4 mg (4 mg Intravenous Given 02/15/22 2349)  diazepam (VALIUM) injection 2.5 mg (2.5 mg Intravenous Given 02/15/22 2350)    ED Course/ Medical Decision Making/ A&P Clinical Course as of 02/19/22 0151  Tue Feb 15, 2022  2236 CT CHEST ABDOMEN PELVIS W CONTRAST [RR]    Clinical Course User Index [RR] RSherrell Puller PA-C                            Medical Decision Making Amount and/or Complexity of Data Reviewed Labs: ordered. Radiology: ordered. Decision-making details documented in ED Course.  Risk Prescription drug management.  32year old female presents emerged from today for evaluation of pain after fall.  Differential diagnosis includes is limited to intracranial thoracic trauma, intra-abdominal trauma, fracture, dislocation, sprain, strain, muscle skeletal pain.  Vital signs are unremarkable.  Patient normotensive, afebrile, normal pulse rate, satting well on room air without increased work of breathing.  Physical exam as noted above.  After discussion with my attending, will order CT imaging of the chest abdomen pelvis with add on T and L-spine.  I independently reviewed and interpreted the patient's labs.  CBC shows slight increase in  white blood cell count of 10.9 however no left shift.  Could be hemoconcentration or inflammatory reaction.  Urinalysis shows moderate amount of leukocytes with many bacteria, 20 white blood cells.  Patient denies any urinary symptoms however, will culture but place patient on Keflex as maybe she is having this referred pain from a UTI.  CMP shows mildly elevated total protein 8.3 otherwise no electrolyte or LFT normality.  Urine culture pending.  Chest x-ray shows no acute cardiopulmonary abnormality.  CT scanning of the chest and pelvis shows no acute intrathoracic or intra-abdominal injury.  There is a minimal right nonobstructing nephrolithiasis seen however.  On the CT T-spine there is an acute superior endplate fracture of T8 with minimal loss of height.  No retropulsion or associated lithiasis seen however.  There is no acute fracture or lithiasis of the lumbar spine.  There is a minimal broad-based disc bulges at L4-L5 and L5-S1 without any significant canal stenosis.  T8 is however around the area where patient reports that she has chronic pain.  This might have worsened with the  fracture.  Patient is not driving home.  She was given morphine and Valium for her pain and muscle relaxation.  The patient does not have any neurodeficits.  Her exam other than some midline thoracic tenderness palpation is unremarkable.  There is no other signs of trauma noted.  No bruising or step-offs or deformities.  She is ambulatory.  She does not have any weakness or sensation deficits.  She does not have any red flag back symptoms.  Considered admission however patient does not meet any criteria.  She is safe for discharge home with neurosurgery follow-up.  We discussed using the TSLO brace.  Patient would like to continue with that.  Will give her information for follow-up with [facility].  We discussed return precautions red flag symptoms for back pain and other symptoms.  Discussed placing her on Keflex for possible UTI.  Will also send her home with some narcotic pain medication given the known fracture as well as some sister milligrams ibuprofen and Robaxin.  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  The patient verbalizes her understanding to the instructions and agrees with the plan.  Patient is stable being discharged home in good condition.  I discussed this case with my attending physician who cosigned this note including patient's presenting symptoms, physical exam, and planned diagnostics and interventions. Attending physician stated agreement with plan or made changes to plan which were implemented.   Final Clinical Impression(s) / ED Diagnoses Final diagnoses:  Closed fracture of eighth thoracic vertebra, unspecified fracture morphology, initial encounter ([facility])  Acute cystitis without hematuria    Rx / DC Orders ED Discharge Orders          Ordered    cephALEXin (KEFLEX) 500 MG capsule  3 times daily        02/16/22 0118    methocarbamol (ROBAXIN) 500 MG tablet  2  times daily        02/16/22 0118    oxyCODONE (ROXICODONE) 5 MG immediate release tablet  Every 4 hours PRN        02/16/22 0118    ibuprofen (ADVIL) 600 MG tablet  Every 6 hours        02/16/22 0118              Sherrell Puller, PA-C 02/19/22 0203  Dorie Rank, MD 02/19/22 [phone]

## 2022-02-15 NOTE — ED Triage Notes (Signed)
Pt fell down steps and injured her back today.

## 2022-02-15 NOTE — ED Notes (Signed)
Patient is being discharged from the Urgent Care and sent to the Emergency Department via car  . Per dr,webborn , patient is in need of higher level of care due to high level of care. Patient is aware and verbalizes understanding of plan of care.  Vitals:   02/15/22 1604  BP: 119/80  Pulse: (!) 106  Resp: 12  Temp: 98 F (36.7 C)  SpO2: 98%

## 2022-02-15 NOTE — ED Provider Notes (Addendum)
[place]    CSN: [id number] Arrival date & time: 02/15/22  1422      History   Chief Complaint Chief Complaint  Patient presents with   Fall    HPI Cassidy Thompson is a 32 y.o. female.  Patient presents due to a fall that occurred today.  Patient reports that she fell down the stairs on her buttocks and slid down the stairs.  She reports that it feels like "the wind was knocked" out of her.  She denies any pain along her spine.  She denies any numbness or tingling in any of her extremities.  She denies any changes to her breathing or heart palpitations.  She denies any loss of consciousness, hitting head, or headache.  She denies any changes to her bowel or bladder pattern.  She reports that the pain is localized bilaterally over her chest and is worsened with movement, she states " it feels like internal pain".  She is able to ambulate with a steady gait per patient statement.  She reports a history of traumatic brain injury.  Patient is accompanied by her significant other.   Fall Associated symptoms include chest pain (Bilaterally, worsened with movement). Pertinent negatives include no headaches and no shortness of breath.  Back Pain Associated symptoms: chest pain (Bilaterally, worsened with movement)   Associated symptoms: no headaches, no numbness and no weakness     Past Medical History:  Diagnosis Date   Chronic headaches    Closed TBI (traumatic brain injury) ([place]) 2011   Seasonal allergies     Patient Active Problem List   Diagnosis Date Noted   SVD (spontaneous vaginal delivery) 12/28/2020   Migraines 03/22/2018   Frequent headaches 03/22/2018    Past Surgical History:  Procedure Laterality Date   WISDOM TOOTH EXTRACTION      OB History     Gravida  2   Para  1   Term  1   Preterm      AB  1   Living  1      SAB  1   IAB      Ectopic      Multiple  0   Live Births  1            Home Medications    Prior to  Admission medications   Medication Sig Start Date End Date Taking? Authorizing Provider  acetaminophen (TYLENOL) 325 MG tablet Take 2 tablets (650 mg total) by mouth every 4 (four) hours as needed (for pain scale < 4  OR  temperature  >/=  100.5 F). 12/28/20   Slaughterbeck, Raquel Sarna, CNM  cetirizine (ZYRTEC) 10 MG tablet TAKE 1 TABLET (10 MG TOTAL) BY MOUTH DAILY. FOR ALLERGIES. 09/18/18   Pleas Koch, NP  cholecalciferol (VITAMIN D3) 25 MCG (1000 UNIT) tablet Take 1,000 Units by mouth daily.    [provider]  ibuprofen (ADVIL) 600 MG tablet Take 1 tablet (600 mg total) by mouth every 6 (six) hours. 12/28/20   Slaughterbeck, Raquel Sarna, CNM  montelukast (SINGULAIR) 10 MG tablet TAKE 1 TABLET (10 MG TOTAL) BY MOUTH AT BEDTIME. FOR ALLERGIES. 09/18/18   Pleas Koch, NP    Family History Family History  Problem Relation Age of Onset   Kidney Stones Mother    Diabetes Maternal Grandmother    Heart attack Maternal Grandfather     Social History Social History   Tobacco Use   Smoking status: Former   Smokeless tobacco:  Never  Substance Use Topics   Alcohol use: Not Currently    Comment: occasional   Drug use: No     Allergies   Patient has no known allergies.   Review of Systems Review of Systems  Respiratory:  Negative for chest tightness and shortness of breath.   Cardiovascular:  Positive for chest pain (Bilaterally, worsened with movement). Negative for palpitations.  Gastrointestinal:  Negative for nausea and vomiting.  Musculoskeletal:  Positive for back pain.  Skin:  Negative for color change.  Neurological:  Negative for dizziness, tremors, syncope, facial asymmetry, speech difficulty, weakness, light-headedness, numbness and headaches.     Physical Exam Triage Vital Signs ED Triage Vitals  Enc Vitals Group     BP 02/15/22 1604 119/80     Pulse Rate 02/15/22 1604 (!) 106     Resp 02/15/22 1604 12     Temp 02/15/22 1604 98 F (36.7 C)     Temp  Source 02/15/22 1604 Oral     SpO2 02/15/22 1604 98 %     Weight --      Height --      Head Circumference --      Peak Flow --      Pain Score 02/15/22 1602 10     Pain Loc --      Pain Edu? --      Excl. in [place]? --    No data found.  Updated Vital Signs BP 119/80 (BP Location: Right Arm)   Pulse (!) 106   Temp 98 F (36.7 C) (Oral)   Resp 12   LMP 02/10/2022   SpO2 98%     Physical Exam Vitals and nursing note reviewed.  Constitutional:      Appearance: Normal appearance.  Cardiovascular:     Rate and Rhythm: Normal rate and regular rhythm.     Heart sounds: Normal heart sounds, S1 normal and S2 normal.  Pulmonary:     Effort: Pulmonary effort is normal.     Breath sounds: Normal breath sounds and air entry. No decreased breath sounds, wheezing, rhonchi or rales.  Musculoskeletal:     Cervical back: No edema, erythema or bony tenderness. No pain with movement.     Thoracic back: No swelling, edema, tenderness or bony tenderness.     Lumbar back: No swelling, edema, tenderness or bony tenderness. Normal range of motion.  Neurological:     Mental Status: She is alert.      UC Treatments / Results  Labs (all labs ordered are listed, but only abnormal results are displayed) Labs Reviewed - No data to display  EKG   Radiology DG Chest 2 View  Result Date: 02/15/2022 CLINICAL DATA:  Fall EXAM: CHEST - 2 VIEW COMPARISON:  None Available. FINDINGS: The heart size and mediastinal contours are within normal limits. No focal pulmonary opacity. No pleural effusion or pneumothorax. The visualized upper abdomen is unremarkable. No acute osseous abnormality. IMPRESSION: No acute cardiopulmonary abnormality. Electronically Signed   By: Beryle Flock M.D.   On: 02/15/2022 16:51    Procedures Procedures (including critical care time)  Medications Ordered in UC Medications  HYDROcodone-acetaminophen (NORCO/VICODIN) 5-325 MG per tablet 1 tablet (1 tablet Oral Given 02/15/22  1617)    Initial Impression / Assessment and Plan / UC Course  I have reviewed the triage vital signs and the nursing notes.  Pertinent labs & imaging results that were available during my care of the patient were reviewed by me and  considered in my medical decision making (see chart for details).    Patient was evaluated for fall.  Cervical, thoracic , and lumbar exam was reassuring.  Hydrocodone given in office, provided minimal relief of patient's symptoms.  Chest x-ray performed due to patient's extent of pain.  Chest x-ray showed no cardiopulmonary abnormalities.  Patient was made aware of test results.  Patient stated that she was still in a significant amount of pain.  Patient was given the option to monitor symptoms at home and refer to emergency department if symptoms do not improve.  Patient stated that she felt that it was necessary that she go to the emergency department.  Patient was referred to emergency department for further evaluation.  Patient's significant other will be transporting patient.   Charting was provided using a a verbal dictation system, charting was proofread for errors, errors may occur which could change the meaning of the information charted.   Final Clinical Impressions(s) / UC Diagnoses   Final diagnoses:  Fall, initial encounter     Discharge Instructions      Please go to to the emergency department for further evaluation.     ED Prescriptions   None    PDMP not reviewed this encounter.   Flossie Dibble, NP 02/15/22 1734    Flossie Dibble, NP 02/15/22 1735

## 2022-02-15 NOTE — Discharge Instructions (Signed)
Please go to to the emergency department for further evaluation.

## 2022-02-16 MED ORDER — CEPHALEXIN 500 MG PO CAPS: 500.0000 mg | ORAL_CAPSULE | Freq: Three times a day (TID) | ORAL | 0 refills | Status: DC

## 2022-02-16 MED ORDER — IBUPROFEN 600 MG PO TABS: 600.0000 mg | ORAL_TABLET | Freq: Four times a day (QID) | ORAL | 0 refills | Status: DC

## 2022-02-16 MED ORDER — METHOCARBAMOL 500 MG PO TABS: 500.0000 mg | ORAL_TABLET | Freq: Two times a day (BID) | ORAL | 0 refills | Status: DC

## 2022-02-16 MED ORDER — OXYCODONE HCL 5 MG PO TABS: 5.0000 mg | ORAL_TABLET | ORAL | 0 refills | Status: DC | PRN

## 2022-02-16 NOTE — Discharge Instructions (Addendum)
You were seen in the ER for evaluation after a fall. Your CT scans show that you have small fracture to your T8 vertebra. We have ordered a back brace for you. Additionally, your urine shows signs of infection. I will treat this with an antibiotic.  You will take Keflex 3 times a day for the next week.  For pain, I recommend taking 1000 mg of Tylenol and 600 mg of ibuprofen together every 6 hours as needed for pain.  I would adhere to this medication regimen at least for the next 48 hours.  You can try lidocaine patches.  If you are having breakthrough pain, you can take an narcotic pain medication.  These do not come without risks.  Please do not operate any heavy machinery or drive while taking muscle laxer's or narcotic pain medication.  I have included information for the neurosurgeon into the discharge paperwork.  I have also included additional information on thoracic spine fractures and urinary tract infections.  If you have any concerns, new or worsening symptoms, please return to the nearest emergency department for evaluation.  Contact a doctor if: You have a fever. You have a cough that makes your pain worse. Your pain medicine is not helping. Your pain does not get better over time. You cannot return to your normal activities as planned. Get help right away if: Your pain is bad and it suddenly gets worse. You are not able to move any part of your body (paralysis) that is below the level of your injury. You have numbness, tingling, or weakness in any part of your body that is below the level of your injury. You cannot control when you pee (urinate) or when you poop (pass stool).

## 2022-02-16 NOTE — ED Notes (Signed)
Reviewed AVS/discharge instruction with patient. Time allotted for and all questions answered. Patient is agreeable for d/c and escorted to ed exit by staff.  

## 2022-02-16 NOTE — ED Notes (Signed)
Ortho tech called again to obtain an eta.

## 2022-02-17 LAB — URINE CULTURE: Culture: <10000 — AB

## 2022-02-23 ENCOUNTER — Encounter: Payer: Self-pay | Admitting: Primary Care

## 2022-02-23 ENCOUNTER — Ambulatory Visit: Payer: BC Managed Care – PPO | Admitting: Primary Care

## 2022-02-23 VITALS — BP 128/88 | HR 85 | Temp 97.8°F | Ht 67.0 in | Wt 204.0 lb

## 2022-02-23 DIAGNOSIS — S22069S Unspecified fracture of T7-T8 vertebra, sequela: Secondary | ICD-10-CM | POA: Diagnosis not present

## 2022-02-23 DIAGNOSIS — J309 Allergic rhinitis, unspecified: Secondary | ICD-10-CM

## 2022-02-23 DIAGNOSIS — S22069D Unspecified fracture of T7-T8 vertebra, subsequent encounter for fracture with routine healing: Secondary | ICD-10-CM

## 2022-02-23 DIAGNOSIS — H539 Unspecified visual disturbance: Secondary | ICD-10-CM

## 2022-02-23 DIAGNOSIS — S22009A Unspecified fracture of unspecified thoracic vertebra, initial encounter for closed fracture: Secondary | ICD-10-CM | POA: Insufficient documentation

## 2022-02-23 LAB — HEMOGLOBIN A1C: Hgb A1c MFr Bld: 5.3 % (ref 4.6–6.5)

## 2022-02-23 LAB — TSH: TSH: 2.37 u[IU]/mL (ref 0.35–5.50)

## 2022-02-23 NOTE — Assessment & Plan Note (Signed)
Reviewed Urgent Care and ED notes, labs, imaging from January 2024.  Continue to wear TSLO brace.  Follow up with neurosurgery as scheduled.

## 2022-02-23 NOTE — Progress Notes (Signed)
Subjective:    Patient ID: Cassidy Thompson, female    DOB: 1990/05/22, 32 y.o.   MRN: [id number]  HPI  Cassidy Thompson is a very pleasant 32 y.o. female  has a past medical history of Chronic headaches, Closed TBI (traumatic brain injury) ([place]) (2011), Seasonal allergies, and SVD (spontaneous vaginal delivery) (12/28/2020). who presents today to re-establish care and for hospital follow up.  1) Thoracic Spine Fracture: She presented to Urgent Care on 02/15/22 for a fall. She slipped and fell down 3-4 stairs on her buttocks. She reported feeling like "the wind was knocked out of me". She mentioned that her pain was felt internally. She underwent chest xray which was negative for acute process. Given her pain level she proceeded to the ED for evaluation.  She presented to [facility] ED that same day. During her stay in the ED she underwent lab testing. UA revealed leukocytosis so she was placed on a course of Keflex. She underwent CT chest which was negative. She underwent CT T-spine which revealed acute superior endplate fracture of T8 with minimal loss of height. She was provided with a TSLO brace and was referred to [facility] for outpatient follow up. She was discharged home with narcotics, cephalexin, methocarbamol, and Ibuprofen.   She has an appointment scheduled with her neurosurgeon for tomorrow. She's been taking Tylenol and Ibuprofen for pain. She's had to take methocarbamol a few times at night, isn't sure if it's helping.   She denies numbness/tingling, loss of bowel/bladder control. She completed her Keflex course. Urine culture returned negative for infection.   2) Visual Disturbance: Chronic to the right eye, constant. Evaluated by ophthalmology and retinal specialist who is unsure of the cause for her visual disturbance. She was told to have her A1C checked.   She has a FH of diabetes in maternal grandmother, several maternal aunts. She denies polyuria, polydipsia,  polyphagia.   She denies disturbances to the left eye.  BP Readings from Last 3 Encounters:  02/23/22 128/88  02/16/22 121/73  02/15/22 119/80      Review of Systems  Constitutional:  Negative for fever.  HENT:  Negative for congestion.   Eyes:  Positive for visual disturbance.  Respiratory:  Negative for shortness of breath.   Cardiovascular:  Negative for chest pain.  Gastrointestinal:  Negative for constipation and diarrhea.  Endocrine: Negative for polydipsia, polyphagia and polyuria.  Genitourinary:  Negative for difficulty urinating.  Musculoskeletal:  Positive for arthralgias and myalgias.  Skin:  Negative for color change.  Neurological:  Negative for dizziness and headaches.  Hematological:  Negative for adenopathy.  Psychiatric/Behavioral:  Negative for sleep disturbance.          Past Medical History:  Diagnosis Date   Chronic headaches    Closed TBI (traumatic brain injury) ([place]) 2011   Seasonal allergies    SVD (spontaneous vaginal delivery) 12/28/2020    Social History   Socioeconomic History   Marital status: Single    Spouse name: Not on file   Number of children: Not on file   Years of education: Not on file   Highest education level: Not on file  Occupational History   Not on file  Tobacco Use   Smoking status: Former   Smokeless tobacco: Never  Substance and Sexual Activity   Alcohol use: Not Currently    Comment: occasional   Drug use: No   Sexual activity: Yes  Other Topics Concern   Not on file  Social  History Narrative   Not on file   Social Determinants of Health   Financial Resource Strain: Not on file  Food Insecurity: Not on file  Transportation Needs: Not on file  Physical Activity: Not on file  Stress: Not on file  Social Connections: Not on file  Intimate Partner Violence: Not on file    Past Surgical History:  Procedure Laterality Date   WISDOM TOOTH EXTRACTION      Family History  Problem Relation Age of  Onset   Kidney Stones Mother    Diabetes Maternal Grandmother    Heart attack Maternal Grandfather     No Known Allergies  Current Outpatient Medications on File Prior to Visit  Medication Sig Dispense Refill   ibuprofen (ADVIL) 600 MG tablet Take 1 tablet (600 mg total) by mouth every 6 (six) hours. 30 tablet 0   methocarbamol (ROBAXIN) 500 MG tablet Take 1 tablet (500 mg total) by mouth 2 (two) times daily. 10 tablet 0   oxyCODONE (ROXICODONE) 5 MG immediate release tablet Take 1 tablet (5 mg total) by mouth every 4 (four) hours as needed for severe pain. 7 tablet 0   cetirizine (ZYRTEC) 10 MG tablet TAKE 1 TABLET (10 MG TOTAL) BY MOUTH DAILY. FOR ALLERGIES. 90 tablet 0   montelukast (SINGULAIR) 10 MG tablet TAKE 1 TABLET (10 MG TOTAL) BY MOUTH AT BEDTIME. FOR ALLERGIES. (Patient not taking: Reported on 02/23/2022) 90 tablet 0   No current facility-administered medications on file prior to visit.    BP 128/88   Pulse 85   Temp 97.8 F (36.6 C) (Temporal)   Ht '5\' 7"'$  (1.702 m)   Wt 204 lb (92.5 kg)   LMP 02/10/2022   SpO2 98%   BMI 31.95 kg/m  Objective:   Physical Exam Cardiovascular:     Rate and Rhythm: Normal rate and regular rhythm.  Pulmonary:     Effort: Pulmonary effort is normal.     Breath sounds: Normal breath sounds.  Abdominal:     Palpations: Abdomen is soft.     Tenderness: There is no abdominal tenderness.  Musculoskeletal:     Cervical back: Neck supple.     Thoracic back: Decreased range of motion.  Skin:    General: Skin is warm and dry.  Neurological:     Mental Status: She is alert and oriented to person, place, and time.  Psychiatric:        Mood and Affect: Mood normal.           Assessment & Plan:  Closed fracture of eighth thoracic vertebra, unspecified fracture morphology, sequela Assessment & Plan: Reviewed Urgent Care and ED notes, labs, imaging from January 2024.  Continue to wear TSLO brace.  Follow up with neurosurgery as  scheduled.    Visual disturbances Assessment & Plan: Unclear etiology.  Following with retina specialist.  Checking A1C, TSH, ANA, RPR today.    Orders: -     Hemoglobin A1c -     TSH -     RPR -     ANA  Allergic rhinitis, unspecified seasonality, unspecified trigger Assessment & Plan: Stable.  Continue Singulair 10 mg PRN for which she uses sparingly.         Pleas Koch, NP

## 2022-02-23 NOTE — Assessment & Plan Note (Addendum)
Unclear etiology.  Following with retina specialist.  Checking A1C, TSH, ANA, RPR today.

## 2022-02-23 NOTE — Patient Instructions (Signed)
Stop by the lab prior to leaving today. I will notify you of your results once received.   It was a pleasure to see you today!  

## 2022-02-23 NOTE — Assessment & Plan Note (Signed)
Stable.  Continue Singulair 10 mg PRN for which she uses sparingly.

## 2022-02-24 ENCOUNTER — Encounter: Payer: Self-pay | Admitting: Primary Care

## 2022-02-24 DIAGNOSIS — S22060A Wedge compression fracture of T7-T8 vertebra, initial encounter for closed fracture: Secondary | ICD-10-CM | POA: Diagnosis not present

## 2022-02-24 LAB — RPR: RPR Ser Ql: NONREACTIVE

## 2022-02-24 LAB — ANA: Anti Nuclear Antibody (ANA): NEGATIVE

## 2022-03-17 DIAGNOSIS — Z6831 Body mass index (BMI) 31.0-31.9, adult: Secondary | ICD-10-CM | POA: Diagnosis not present

## 2022-03-17 DIAGNOSIS — S22060A Wedge compression fracture of T7-T8 vertebra, initial encounter for closed fracture: Secondary | ICD-10-CM | POA: Diagnosis not present

## 2022-03-28 ENCOUNTER — Encounter (INDEPENDENT_AMBULATORY_CARE_PROVIDER_SITE_OTHER): Payer: BC Managed Care – PPO | Admitting: Ophthalmology

## 2022-03-31 ENCOUNTER — Telehealth: Payer: Self-pay | Admitting: Primary Care

## 2022-03-31 NOTE — Telephone Encounter (Signed)
Pt called stating her Retina provider, Dr.John Zigmund Daniel at [facility].referred her to a provider in [facility]. Pt states the office is asking for her latest cpe office notes along with lab results. Pt is asking can Carlis Abbott get that ppw for her & pick it up once comp? Pt requested a call once ppw is ready. Pt states her appointment is 04/21/22. Call back # [phone]

## 2022-03-31 NOTE — Telephone Encounter (Signed)
Called and spoke to patient, no CPE in chart to print notes out for. Printed out lab results from 02/23/22 and placed up front with reception. Patient is aware of this and will come pickup.

## 2022-04-15 ENCOUNTER — Ambulatory Visit: Payer: BC Managed Care – PPO | Admitting: Family

## 2022-04-19 DIAGNOSIS — S22060A Wedge compression fracture of T7-T8 vertebra, initial encounter for closed fracture: Secondary | ICD-10-CM | POA: Diagnosis not present

## 2022-04-19 DIAGNOSIS — Z6830 Body mass index (BMI) 30.0-30.9, adult: Secondary | ICD-10-CM | POA: Diagnosis not present

## 2022-04-21 DIAGNOSIS — H30031 Focal chorioretinal inflammation, peripheral, right eye: Secondary | ICD-10-CM | POA: Diagnosis not present

## 2022-04-21 DIAGNOSIS — H3581 Retinal edema: Secondary | ICD-10-CM | POA: Diagnosis not present

## 2022-06-05 DIAGNOSIS — J029 Acute pharyngitis, unspecified: Secondary | ICD-10-CM | POA: Diagnosis not present

## 2022-06-05 DIAGNOSIS — Z6830 Body mass index (BMI) 30.0-30.9, adult: Secondary | ICD-10-CM | POA: Diagnosis not present

## 2022-06-21 DIAGNOSIS — H3581 Retinal edema: Secondary | ICD-10-CM | POA: Diagnosis not present

## 2022-06-21 DIAGNOSIS — H30031 Focal chorioretinal inflammation, peripheral, right eye: Secondary | ICD-10-CM | POA: Diagnosis not present

## 2022-06-21 DIAGNOSIS — Z79899 Other long term (current) drug therapy: Secondary | ICD-10-CM | POA: Diagnosis not present

## 2022-09-14 DIAGNOSIS — Z79899 Other long term (current) drug therapy: Secondary | ICD-10-CM | POA: Diagnosis not present

## 2022-09-14 DIAGNOSIS — H30031 Focal chorioretinal inflammation, peripheral, right eye: Secondary | ICD-10-CM | POA: Diagnosis not present

## 2022-09-20 DIAGNOSIS — H30031 Focal chorioretinal inflammation, peripheral, right eye: Secondary | ICD-10-CM | POA: Diagnosis not present

## 2022-09-20 DIAGNOSIS — Z79899 Other long term (current) drug therapy: Secondary | ICD-10-CM | POA: Diagnosis not present

## 2022-09-20 DIAGNOSIS — H3581 Retinal edema: Secondary | ICD-10-CM | POA: Diagnosis not present

## 2022-12-14 ENCOUNTER — Encounter: Payer: BC Managed Care – PPO | Admitting: Primary Care

## 2022-12-14 ENCOUNTER — Ambulatory Visit (INDEPENDENT_AMBULATORY_CARE_PROVIDER_SITE_OTHER): Payer: BC Managed Care – PPO | Admitting: Primary Care

## 2022-12-14 ENCOUNTER — Encounter: Payer: Self-pay | Admitting: Primary Care

## 2022-12-14 VITALS — BP 112/74 | HR 111 | Temp 97.8°F | Ht 67.0 in | Wt 194.0 lb

## 2022-12-14 DIAGNOSIS — Z0000 Encounter for general adult medical examination without abnormal findings: Secondary | ICD-10-CM | POA: Diagnosis not present

## 2022-12-14 DIAGNOSIS — S22069S Unspecified fracture of T7-T8 vertebra, sequela: Secondary | ICD-10-CM | POA: Diagnosis not present

## 2022-12-14 DIAGNOSIS — G43009 Migraine without aura, not intractable, without status migrainosus: Secondary | ICD-10-CM

## 2022-12-14 DIAGNOSIS — H30031 Focal chorioretinal inflammation, peripheral, right eye: Secondary | ICD-10-CM | POA: Insufficient documentation

## 2022-12-14 DIAGNOSIS — R519 Headache, unspecified: Secondary | ICD-10-CM

## 2022-12-14 NOTE — Patient Instructions (Signed)
It was a pleasure to see you today!   

## 2022-12-14 NOTE — Progress Notes (Signed)
Subjective:    Patient ID: Cassidy Thompson, female    DOB: February 16, 1990, 32 y.o.   MRN: [id number]  HPI  Cassidy Thompson is a very pleasant 32 y.o. female who presents today for complete physical and follow up of chronic conditions.  Immunizations: -Tetanus: UTD per patient  -Influenza: Declines influenza vaccine.   Diet: Fair diet.  Exercise: No regular exercise. Active at home.   Eye exam: Completes every 3 months Dental exam: Completes semi-annually    Pap Smear: Completed in April 2022   BP Readings from Last 3 Encounters:  12/14/22 112/74  02/23/22 128/88  02/16/22 121/73      Review of Systems  Constitutional:  Negative for unexpected weight change.  HENT:  Negative for rhinorrhea.   Respiratory:  Negative for cough and shortness of breath.   Cardiovascular:  Negative for chest pain.  Gastrointestinal:  Negative for constipation and diarrhea.  Genitourinary:  Negative for difficulty urinating and menstrual problem.  Musculoskeletal:  Negative for arthralgias and myalgias.  Skin:  Negative for rash.  Allergic/Immunologic: Negative for environmental allergies.  Neurological:  Negative for dizziness and headaches.  Psychiatric/Behavioral:  The patient is not nervous/anxious.          Past Medical History:  Diagnosis Date   Chronic headaches    Closed TBI (traumatic brain injury) ([facility]) 2011   Seasonal allergies    SVD (spontaneous vaginal delivery) 12/28/2020    Social History   Socioeconomic History   Marital status: Single    Spouse name: Not on file   Number of children: Not on file   Years of education: Not on file   Highest education level: Not on file  Occupational History   Not on file  Tobacco Use   Smoking status: Former   Smokeless tobacco: Never  Substance and Sexual Activity   Alcohol use: Not Currently    Comment: occasional   Drug use: No   Sexual activity: Yes  Other Topics Concern   Not on file  Social History Narrative    Not on file   Social Determinants of Health   Financial Resource Strain: Not on file  Food Insecurity: Not on file  Transportation Needs: Not on file  Physical Activity: Not on file  Stress: Not on file  Social Connections: Not on file  Intimate Partner Violence: Not on file    Past Surgical History:  Procedure Laterality Date   WISDOM TOOTH EXTRACTION      Family History  Problem Relation Age of Onset   Kidney Stones Mother    Diabetes Maternal Grandmother    Heart attack Maternal Grandfather     No Known Allergies  Current Outpatient Medications on File Prior to Visit  Medication Sig Dispense Refill   azaTHIOprine (IMURAN) 50 MG tablet Take 150 mg by mouth daily.     No current facility-administered medications on file prior to visit.    BP 112/74   Pulse (!) 111   Temp 97.8 F (36.6 C) (Temporal)   Ht 5\' 7"  (1.702 m)   Wt 194 lb (88 kg)   LMP 12/06/2022   SpO2 98%   BMI 30.38 kg/m  Objective:   Physical Exam HENT:     Right Ear: Tympanic membrane and ear canal normal.     Left Ear: Tympanic membrane and ear canal normal.  Eyes:     Pupils: Pupils are equal, round, and reactive to light.  Cardiovascular:     Rate and Rhythm:  Normal rate and regular rhythm.  Pulmonary:     Effort: Pulmonary effort is normal.     Breath sounds: Normal breath sounds.  Abdominal:     General: Bowel sounds are normal.     Palpations: Abdomen is soft.     Tenderness: There is no abdominal tenderness.  Musculoskeletal:        General: Normal range of motion.     Cervical back: Neck supple.  Skin:    General: Skin is warm and dry.  Neurological:     Mental Status: She is alert and oriented to person, place, and time.     Cranial Nerves: No cranial nerve deficit.     Deep Tendon Reflexes:     Reflex Scores:      Patellar reflexes are 2+ on the right side and 2+ on the left side. Psychiatric:        Mood and Affect: Mood normal.           Assessment & Plan:   Preventative health care Assessment & Plan: Immunizations UTD per patient. Pap smear UTD.  Completed per GYN  Discussed the importance of a healthy diet and regular exercise in order for weight loss, and to reduce the risk of further co-morbidity.  Exam stable. Labs pending.  Follow up in 1 year for repeat physical.    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   Continue azathioprine 150 mg daily.    Migraine without aura and without status migrainosus, not intractable Assessment & Plan: Controlled.  No concerns today. Continue to monitor.    Frequent headaches Assessment & Plan: Controlled.  No concerns today.   Closed fracture of eighth thoracic vertebra, unspecified fracture morphology, sequela Assessment & Plan: Improving.   Continue to monitor.          Doreene Nest, NP

## 2022-12-14 NOTE — Assessment & Plan Note (Signed)
Controlled. No concerns today. 

## 2022-12-14 NOTE — Assessment & Plan Note (Signed)
Controlled.  No concerns today. Continue to monitor.

## 2022-12-14 NOTE — Assessment & Plan Note (Signed)
Following with ophthalmology, reviewed office notes from August 2024.   Continue azathioprine 150 mg daily.

## 2022-12-14 NOTE — Assessment & Plan Note (Signed)
Immunizations UTD per patient. Pap smear UTD.  Completed per GYN  Discussed the importance of a healthy diet and regular exercise in order for weight loss, and to reduce the risk of further co-morbidity.  Exam stable. Labs pending.  Follow up in 1 year for repeat physical.

## 2022-12-14 NOTE — Assessment & Plan Note (Signed)
Improving  Continue to monitor 

## 2023-03-02 DIAGNOSIS — Z79899 Other long term (current) drug therapy: Secondary | ICD-10-CM | POA: Diagnosis not present

## 2023-03-02 DIAGNOSIS — H30031 Focal chorioretinal inflammation, peripheral, right eye: Secondary | ICD-10-CM | POA: Diagnosis not present

## 2023-03-07 DIAGNOSIS — H30031 Focal chorioretinal inflammation, peripheral, right eye: Secondary | ICD-10-CM | POA: Diagnosis not present

## 2023-03-07 DIAGNOSIS — H3581 Retinal edema: Secondary | ICD-10-CM | POA: Diagnosis not present

## 2023-03-07 DIAGNOSIS — Z79899 Other long term (current) drug therapy: Secondary | ICD-10-CM | POA: Diagnosis not present

## 2023-04-18 DIAGNOSIS — H3581 Retinal edema: Secondary | ICD-10-CM | POA: Diagnosis not present

## 2023-04-18 DIAGNOSIS — Z79899 Other long term (current) drug therapy: Secondary | ICD-10-CM | POA: Diagnosis not present

## 2023-04-18 DIAGNOSIS — H30031 Focal chorioretinal inflammation, peripheral, right eye: Secondary | ICD-10-CM | POA: Diagnosis not present

## 2023-04-18 DIAGNOSIS — H44113 Panuveitis, bilateral: Secondary | ICD-10-CM | POA: Diagnosis not present

## 2023-06-07 DIAGNOSIS — Z79899 Other long term (current) drug therapy: Secondary | ICD-10-CM | POA: Diagnosis not present

## 2023-06-07 DIAGNOSIS — H30031 Focal chorioretinal inflammation, peripheral, right eye: Secondary | ICD-10-CM | POA: Diagnosis not present

## 2023-06-07 DIAGNOSIS — H44113 Panuveitis, bilateral: Secondary | ICD-10-CM | POA: Diagnosis not present

## 2023-06-13 DIAGNOSIS — H3581 Retinal edema: Secondary | ICD-10-CM | POA: Diagnosis not present

## 2023-06-13 DIAGNOSIS — H30031 Focal chorioretinal inflammation, peripheral, right eye: Secondary | ICD-10-CM | POA: Diagnosis not present

## 2023-06-13 DIAGNOSIS — H44113 Panuveitis, bilateral: Secondary | ICD-10-CM | POA: Diagnosis not present

## 2023-06-13 DIAGNOSIS — Z79899 Other long term (current) drug therapy: Secondary | ICD-10-CM | POA: Diagnosis not present

## 2023-09-14 DIAGNOSIS — H30031 Focal chorioretinal inflammation, peripheral, right eye: Secondary | ICD-10-CM | POA: Diagnosis not present

## 2023-09-14 DIAGNOSIS — H44113 Panuveitis, bilateral: Secondary | ICD-10-CM | POA: Diagnosis not present

## 2023-09-14 DIAGNOSIS — Z79899 Other long term (current) drug therapy: Secondary | ICD-10-CM | POA: Diagnosis not present

## 2023-09-19 DIAGNOSIS — H44113 Panuveitis, bilateral: Secondary | ICD-10-CM | POA: Diagnosis not present

## 2023-09-19 DIAGNOSIS — H30031 Focal chorioretinal inflammation, peripheral, right eye: Secondary | ICD-10-CM | POA: Diagnosis not present

## 2023-09-19 DIAGNOSIS — H3581 Retinal edema: Secondary | ICD-10-CM | POA: Diagnosis not present

## 2023-09-19 DIAGNOSIS — Z79899 Other long term (current) drug therapy: Secondary | ICD-10-CM | POA: Diagnosis not present

## 2023-09-20 DIAGNOSIS — H30031 Focal chorioretinal inflammation, peripheral, right eye: Secondary | ICD-10-CM | POA: Diagnosis not present

## 2023-12-11 ENCOUNTER — Encounter: Payer: Self-pay | Admitting: Primary Care

## 2024-01-18 DIAGNOSIS — H30031 Focal chorioretinal inflammation, peripheral, right eye: Secondary | ICD-10-CM | POA: Diagnosis not present

## 2024-01-18 DIAGNOSIS — Z79899 Other long term (current) drug therapy: Secondary | ICD-10-CM | POA: Diagnosis not present

## 2024-01-18 DIAGNOSIS — H44113 Panuveitis, bilateral: Secondary | ICD-10-CM | POA: Diagnosis not present
